# Patient Record
Sex: Female | Born: 1960 | Race: Black or African American | Hispanic: No | Smoking: Current every day smoker
Health system: Southern US, Community
[De-identification: ages and names within clinical notes are randomized; demographics above are authoritative.]

## PROBLEM LIST (undated history)

## (undated) DIAGNOSIS — I10 Essential (primary) hypertension: Secondary | ICD-10-CM

## (undated) DIAGNOSIS — I2699 Other pulmonary embolism without acute cor pulmonale: Secondary | ICD-10-CM

## (undated) DIAGNOSIS — F32A Depression, unspecified: Secondary | ICD-10-CM

## (undated) DIAGNOSIS — I509 Heart failure, unspecified: Secondary | ICD-10-CM

## (undated) DIAGNOSIS — F319 Bipolar disorder, unspecified: Secondary | ICD-10-CM

## (undated) DIAGNOSIS — G4730 Sleep apnea, unspecified: Secondary | ICD-10-CM

## (undated) DIAGNOSIS — F329 Major depressive disorder, single episode, unspecified: Secondary | ICD-10-CM

## (undated) DIAGNOSIS — J219 Acute bronchiolitis, unspecified: Secondary | ICD-10-CM

## (undated) HISTORY — PX: CHOLECYSTECTOMY: SHX55

## (undated) HISTORY — PX: CAROTID STENT: SHX1301

---

## 2005-02-02 ENCOUNTER — Inpatient Hospital Stay (HOSPITAL_COMMUNITY): Admission: AD | Admit: 2005-02-02 | Discharge: 2005-02-02 | Payer: Self-pay | Admitting: *Deleted

## 2005-02-19 ENCOUNTER — Ambulatory Visit: Payer: Self-pay | Admitting: *Deleted

## 2005-04-22 ENCOUNTER — Ambulatory Visit: Payer: Self-pay | Admitting: Cardiovascular Disease

## 2005-04-22 ENCOUNTER — Inpatient Hospital Stay (HOSPITAL_COMMUNITY): Admission: EM | Admit: 2005-04-22 | Discharge: 2005-04-25 | Payer: Self-pay | Admitting: Emergency Medicine

## 2005-04-29 ENCOUNTER — Ambulatory Visit: Payer: Self-pay | Admitting: Internal Medicine

## 2005-05-01 ENCOUNTER — Ambulatory Visit: Payer: Self-pay | Admitting: Internal Medicine

## 2005-05-05 ENCOUNTER — Ambulatory Visit: Payer: Self-pay | Admitting: Internal Medicine

## 2005-05-15 ENCOUNTER — Ambulatory Visit: Payer: Self-pay | Admitting: Cardiology

## 2005-05-22 ENCOUNTER — Ambulatory Visit: Payer: Self-pay | Admitting: Cardiology

## 2005-05-27 ENCOUNTER — Ambulatory Visit: Payer: Self-pay | Admitting: Cardiology

## 2006-01-27 ENCOUNTER — Ambulatory Visit: Payer: Self-pay | Admitting: Cardiology

## 2007-11-15 ENCOUNTER — Emergency Department (HOSPITAL_COMMUNITY): Admission: EM | Admit: 2007-11-15 | Discharge: 2007-11-15 | Payer: Self-pay | Admitting: Emergency Medicine

## 2007-11-16 ENCOUNTER — Inpatient Hospital Stay (HOSPITAL_COMMUNITY): Admission: EM | Admit: 2007-11-16 | Discharge: 2007-11-21 | Payer: Self-pay | Admitting: Emergency Medicine

## 2007-11-17 ENCOUNTER — Encounter (INDEPENDENT_AMBULATORY_CARE_PROVIDER_SITE_OTHER): Payer: Self-pay | Admitting: Internal Medicine

## 2009-02-01 ENCOUNTER — Encounter: Payer: Self-pay | Admitting: Cardiology

## 2009-02-25 ENCOUNTER — Emergency Department (HOSPITAL_COMMUNITY): Admission: EM | Admit: 2009-02-25 | Discharge: 2009-02-25 | Payer: Self-pay | Admitting: Emergency Medicine

## 2009-03-05 ENCOUNTER — Emergency Department (HOSPITAL_COMMUNITY): Admission: EM | Admit: 2009-03-05 | Discharge: 2009-03-05 | Payer: Self-pay | Admitting: Pediatrics

## 2009-04-10 ENCOUNTER — Ambulatory Visit: Payer: Self-pay | Admitting: Obstetrics and Gynecology

## 2009-04-10 ENCOUNTER — Other Ambulatory Visit: Admission: RE | Admit: 2009-04-10 | Discharge: 2009-04-10 | Payer: Self-pay | Admitting: Obstetrics and Gynecology

## 2009-05-15 ENCOUNTER — Ambulatory Visit: Payer: Self-pay | Admitting: Family Medicine

## 2009-05-29 ENCOUNTER — Ambulatory Visit: Payer: Self-pay | Admitting: Obstetrics and Gynecology

## 2009-06-24 ENCOUNTER — Ambulatory Visit (HOSPITAL_COMMUNITY): Admission: RE | Admit: 2009-06-24 | Discharge: 2009-06-24 | Payer: Self-pay | Admitting: Family Medicine

## 2009-06-24 ENCOUNTER — Ambulatory Visit: Payer: Self-pay | Admitting: Family Medicine

## 2009-07-12 ENCOUNTER — Inpatient Hospital Stay (HOSPITAL_COMMUNITY): Admission: AD | Admit: 2009-07-12 | Discharge: 2009-07-12 | Payer: Self-pay | Admitting: Obstetrics & Gynecology

## 2010-07-30 LAB — COMPREHENSIVE METABOLIC PANEL
ALT: 30 U/L (ref 0–35)
AST: 25 U/L (ref 0–37)
Albumin: 3.7 g/dL (ref 3.5–5.2)
Alkaline Phosphatase: 87 U/L (ref 39–117)
BUN: 12 mg/dL (ref 6–23)
CO2: 28 mEq/L (ref 19–32)
Calcium: 9.7 mg/dL (ref 8.4–10.5)
Chloride: 105 mEq/L (ref 96–112)
Creatinine, Ser: 0.92 mg/dL (ref 0.4–1.2)
GFR calc Af Amer: >60 mL/min (ref >60)
GFR calc non Af Amer: >60 mL/min (ref >60)
Glucose, Bld: 134 mg/dL — ABNORMAL HIGH (ref 70–99)
Potassium: 3.2 mEq/L — ABNORMAL LOW (ref 3.5–5.1)
Sodium: 138 mEq/L (ref 135–145)
Total Bilirubin: 0.6 mg/dL (ref 0.3–1.2)
Total Protein: 7.9 g/dL (ref 6.0–8.3)

## 2010-07-30 LAB — PREGNANCY, URINE: Preg Test, Ur: NEGATIVE

## 2010-07-30 LAB — CBC
HCT: 42.8 % (ref 36.0–46.0)
Hemoglobin: 14 g/dL (ref 12.0–15.0)
MCHC: 32.8 g/dL (ref 30.0–36.0)
MCV: 81.1 fL (ref 78.0–100.0)
Platelets: 285 10*3/uL (ref 150–400)
RBC: 5.28 MIL/uL — ABNORMAL HIGH (ref 3.87–5.11)
RDW: 15.7 % — ABNORMAL HIGH (ref 11.5–15.5)
WBC: 9.1 10*3/uL (ref 4.0–10.5)

## 2010-08-03 LAB — CBC
HCT: 40.3 % (ref 36.0–46.0)
Hemoglobin: 13.1 g/dL (ref 12.0–15.0)
MCHC: 32.5 g/dL (ref 30.0–36.0)
MCV: 82.3 fL (ref 78.0–100.0)
Platelets: 207 10*3/uL (ref 150–400)
RBC: 4.9 MIL/uL (ref 3.87–5.11)
RDW: 16.1 % — ABNORMAL HIGH (ref 11.5–15.5)
WBC: 9.1 10*3/uL (ref 4.0–10.5)

## 2010-08-14 LAB — BASIC METABOLIC PANEL
BUN: 10 mg/dL (ref 6–23)
CO2: 25 mEq/L (ref 19–32)
Calcium: 9.2 mg/dL (ref 8.4–10.5)
Chloride: 109 mEq/L (ref 96–112)
Creatinine, Ser: 0.85 mg/dL (ref 0.4–1.2)
GFR calc Af Amer: >60 mL/min (ref >60)
GFR calc non Af Amer: >60 mL/min (ref >60)
Glucose, Bld: 158 mg/dL — ABNORMAL HIGH (ref 70–99)
Potassium: 3.3 mEq/L — ABNORMAL LOW (ref 3.5–5.1)
Sodium: 138 mEq/L (ref 135–145)

## 2010-08-14 LAB — POCT I-STAT, CHEM 8
BUN: 5 mg/dL — ABNORMAL LOW (ref 6–23)
Calcium, Ion: 1.19 mmol/L (ref 1.12–1.32)
Chloride: 103 mEq/L (ref 96–112)
Creatinine, Ser: 0.9 mg/dL (ref 0.4–1.2)
Glucose, Bld: 89 mg/dL (ref 70–99)
HCT: 45 % (ref 36.0–46.0)
Hemoglobin: 15.3 g/dL — ABNORMAL HIGH (ref 12.0–15.0)
Potassium: 3.1 meq/L — ABNORMAL LOW (ref 3.5–5.1)
Sodium: 141 meq/L (ref 135–145)
TCO2: 25 mmol/L (ref 0–100)

## 2010-08-14 LAB — URINE MICROSCOPIC-ADD ON

## 2010-08-14 LAB — URINE CULTURE: Culture: NO GROWTH

## 2010-08-14 LAB — URINALYSIS, ROUTINE W REFLEX MICROSCOPIC
Bilirubin Urine: NEGATIVE
Bilirubin Urine: NEGATIVE
Glucose, UA: 250 mg/dL — AB
Glucose, UA: NEGATIVE mg/dL
Hgb urine dipstick: NEGATIVE
Hgb urine dipstick: NEGATIVE
Ketones, ur: NEGATIVE mg/dL
Ketones, ur: NEGATIVE mg/dL
Nitrite: NEGATIVE
Nitrite: NEGATIVE
Protein, ur: NEGATIVE mg/dL
Protein, ur: NEGATIVE mg/dL
Specific Gravity, Urine: 1.02 (ref 1.005–1.030)
Specific Gravity, Urine: 1.026 (ref 1.005–1.030)
Urobilinogen, UA: 0.2 mg/dL (ref 0.0–1.0)
Urobilinogen, UA: 0.2 mg/dL (ref 0.0–1.0)
pH: 6 (ref 5.0–8.0)
pH: 6 (ref 5.0–8.0)

## 2010-08-14 LAB — CBC
HCT: 39.2 % (ref 36.0–46.0)
HCT: 41.4 % (ref 36.0–46.0)
Hemoglobin: 13.4 g/dL (ref 12.0–15.0)
Hemoglobin: 13.9 g/dL (ref 12.0–15.0)
MCHC: 34.3 g/dL (ref 30.0–36.0)
MCV: 83.1 fL (ref 78.0–100.0)
Platelets: 227 10*3/uL (ref 150–400)
RBC: 4.71 MIL/uL (ref 3.87–5.11)
RBC: 4.93 MIL/uL (ref 3.87–5.11)
RDW: 15.5 % (ref 11.5–15.5)
RDW: 15.9 % — ABNORMAL HIGH (ref 11.5–15.5)
WBC: 8.9 10*3/uL (ref 4.0–10.5)
WBC: 9.5 10*3/uL (ref 4.0–10.5)

## 2010-08-14 LAB — DIFFERENTIAL
Band Neutrophils: 0 % (ref 0–10)
Basophils Absolute: 0.1 10*3/uL (ref 0.0–0.1)
Basophils Relative: 1 % (ref 0–1)
Eosinophils Absolute: 0.3 10*3/uL (ref 0.0–0.7)
Eosinophils Relative: 3 % (ref 0–5)
Lymphocytes Relative: 29 % (ref 12–46)
Lymphs Abs: 2.8 10*3/uL (ref 0.7–4.0)
Metamyelocytes Relative: 0 %
Monocytes Absolute: 0.3 10*3/uL (ref 0.1–1.0)
Monocytes Relative: 3 % (ref 3–12)

## 2010-08-14 LAB — WET PREP, GENITAL
Clue Cells Wet Prep HPF POC: NONE SEEN
Clue Cells Wet Prep HPF POC: NONE SEEN
Trich, Wet Prep: NONE SEEN
Yeast Wet Prep HPF POC: NONE SEEN
Yeast Wet Prep HPF POC: NONE SEEN

## 2010-08-14 LAB — PROTIME-INR
INR: 1.18 (ref 0.00–1.49)
Prothrombin Time: 14.4 seconds (ref 11.6–15.2)
Prothrombin Time: 14.9 seconds (ref 11.6–15.2)

## 2010-08-14 LAB — POCT PREGNANCY, URINE
Preg Test, Ur: NEGATIVE
Preg Test, Ur: NEGATIVE

## 2010-08-14 LAB — SAMPLE TO BLOOD BANK

## 2010-08-14 LAB — GC/CHLAMYDIA PROBE AMP, GENITAL
Chlamydia, DNA Probe: NEGATIVE
Chlamydia, DNA Probe: NEGATIVE
GC Probe Amp, Genital: NEGATIVE

## 2010-08-14 LAB — APTT: aPTT: 27 seconds (ref 24–37)

## 2010-08-14 LAB — GLUCOSE, CAPILLARY: Glucose-Capillary: 132 mg/dL — ABNORMAL HIGH (ref 70–99)

## 2010-08-14 LAB — RPR: RPR Ser Ql: NONREACTIVE

## 2010-09-23 NOTE — Discharge Summary (Signed)
NAME:  Amber Vaughn, Amber Vaughn NO.:  [id number]   MEDICAL RECORD NO.:  [id number]          PATIENT TYPE:  INP   LOCATION:  4731                         FACILITY:  [facility]   PHYSICIAN:  Hillery Aldo, M.D.   DATE OF BIRTH:  07/18/1960   DATE OF ADMISSION:  11/16/2007  DATE OF DISCHARGE:  11/21/2007                               DISCHARGE SUMMARY   PRIMARY CARE PHYSICIAN:  Dr. Michele Rockers in [place].   DISCHARGE DIAGNOSES:  1. Pulmonary embolism.  2. Obesity.  3. Obstructive sleep apnea/obesity hypoventilation syndrome.  4. Hypertensive urgency.  5. Diastolic dysfunction by echocardiogram.  6. Iron-deficiency anemia.  7. Vulvovaginal candidiasis.  8. Depression.  9. Gastroesophageal reflux disease.   DISCHARGE MEDICATIONS:  1. Lovenox 120 mg subcutaneously q.12 h.  2. Coumadin 15 mg daily.  3. Potassium chloride 20 mEq daily.  4. Zantac 150 mg b.i.d.  5. Desyrel 100 mg daily.  6. Celexa 20 mg daily.  7. Catapres 0.2 mg b.i.d.  8. Apresoline 25 mg t.i.d.  9. Iron sulfate 325 mg b.i.d.  10.Metoprolol XL 50 mg b.i.d.   CONSULTATIONS:  Dr. Lenoria Farrier A. Hoss, MD, of Interventional Radiology.   BRIEF ADMISSION HPI:  The patient is a 50 year old female with past  medical history of pulmonary embolism who was recently hospitalized in  [place], [place] for treatment of the same.  She has a history of  pulmonary embolism dating two years in the past as well.  She was  discharged on a combination of Coumadin and Lovenox from [place]  and represented to the hospital here with subtherapeutic INR and  problems with dyspnea.  She was admitted for further evaluation and  treatment.  For the full details, please see the dictated report done by  Dr. Christella Noa.   PROCEDURES AND DIAGNOSTIC STUDIES:  1. Chest x-ray on November 15, 2007, showed no active disease.  2. Chest x-ray on November 16, 2007, showed no acute cardiopulmonary      disease.  3. Ultrasound-guided placement of an IVC  filter done by Interventional      Radiology on November 18, 2007.   DISCHARGE LABORATORY VALUES:  PTT was 48, PT 13.2, and INR 1.0.   HOSPITAL COURSE:  1. Recurrent pulmonary embolism:  The patient has had recurrent      pulmonary embolism, and it is difficult to get therapeutic on      Coumadin.  Given this, the decision was made to pursue IVC filter      placement, which was accomplished on November 18, 2007.  The patient's      INR has been subtherapeutic throughout her hospital stay despite      aggressive therapy with Coumadin.  At this point, she remains on      Lovenox and will be discharged on Lovenox and 15 mg of Coumadin      daily with instructions to follow up with her primary care      physician for ongoing uptitration of her Coumadin to therapeutic      levels.  2. Obstructive sleep apnea:  The patient is maintained on CPAP  nightly.  3. Hypertensive urgency.  The patient was admitted with fairly      elevated blood pressure levels.  Her admission blood pressure was      199/90.  Her antihypertensive regimen was adjusted and at this      time, her blood pressure is reasonably controlled on the regimen as      outlined above.  4. Iron-deficiency anemia:  The patient had anemia panel done and was      found to have a microcytic anemia.  She has heavy menses.  She was      put on iron supplementation.  5. Vulvovaginal candidiasis.  The patient was given Diflucan and      instructed to follow up with her primary care physician for any      nonresolution of symptoms.   DISPOSITION:  The patient is medically stable and will be discharged  home today.      Hillery Aldo, M.D.  Electronically Signed     CR/MEDQ  D:  11/21/2007  T:  11/21/2007  Job:  [id number]

## 2010-09-23 NOTE — H&P (Signed)
NAME:  Amber Vaughn, Amber Vaughn NO.:  [id number]   MEDICAL RECORD NO.:  [id number]          PATIENT TYPE:  INP   LOCATION:  4731                         FACILITY[facility]  [facility]   PHYSICIAN:  Herbie Saxon, MDDATE OF BIRTH:  08/27/1960   DATE OF ADMISSION:  11/16/2007  DATE OF DISCHARGE:                              HISTORY & PHYSICAL   PRIMARY CARE PHYSICIAN:  Unassigned.   She has no health care power of attorney.  She is a full code.   PRESENTING COMPLAINT:  Shortness of breath, one week.   HISTORY OF PRESENTING COMPLAINT:  This is a 50 year old African American  female who was recently diagnosed with pulmonary embolism about 2 weeks  ago at [facility] in [place].  The patient was  started on Lovenox and Coumadin and there was a plan for possible IVC  placement.  However, for the last one week, she has been becoming  increasingly much short of breath associated with wheezing, intermittent  hemoptysis, and lightheadedness.  No fever.  She has an unproductive  cough mostly.  The patient represented back to the [facility]  in [place] about a week ago and she was admitted for a week and  discharged yesterday morning.  She presented to the [facility] Emergency  Room the last night with hemoptysis, with epistaxis, nose bleeding;  however with pressure this had stopped.  She was discharged home,  however, she came back to the emergency room again today complaining of  retrosternal chest pain, lightheadedness, low-grade fever.  At  presentation, her blood pressure was severely elevated at 199/90.  INR  was still subtherapeutic at 1.3.  Currently, she does not have any  epistaxis or hemoptysis, also blood glucose checked at presentation was  64.  The patient denies any abdominal distension, diarrhea, or nausea.  She has abdominal cramps intermittently.  No dysuria, hematuria, melena,  or hematemesis.  No new skin rash or joint swelling.   PAST MEDICAL HISTORY:  Hypertension and pulmonary embolism that was  diagnosed 2 years ago, but she was readmitted at [facility] 2 weeks ago.   PAST SURGICAL HISTORY:  Cholecystectomy and cesarean section x2.   SOCIAL HISTORY:  She smoked for more than 20 years, about 4-5 cigarettes  a day, which is ongoing until a week ago when she was admitted.  No  history of alcohol or illicit drug abuse.   FAMILY HISTORY:  Her father had chronic kidney disease, hypertension,  and myocardial infarction.  Mother has angina and hypertension.  Sister  has coronary artery disease.   REVIEW OF SYSTEMS:  The patient complains of headache and palpitation.  A 14 other systems are reviewed and negative.   ALLERGIES:  PENICILLIN, full dose ASPIRIN.  She claims she can tolerate  the baby aspirin.   MEDICATIONS:  1. Lovenox 120 mg twice daily.  2. Coumadin 11 mg daily.  3. Potassium chloride 20 mEq daily.  4. Zantac 150 mg twice daily.  5. Desyrel 100 mg daily.  6. Celexa 20 mg daily.  7. Catapres 0.2 mg twice daily.  8. Apresoline  25 mg b.i.d.   PHYSICAL EXAMINATION:  GENERAL:  On examination, she is a middle-aged  lady, obese, and not in acute respiratory distress.  VITAL SIGNS:  Temperature is 99, pulse 61, respiratory rate is 18, and  blood pressure 199/90.  HEENT:  Pupils are equal, reactive to light, and accommodation.  Not  pale, not jaundiced.  Oropharynx and nasopharynx are clear.  Mucous  membranes are moist.  Head is atraumatic and normocephalic.  NECK:  Supple.  There is no elevated JVD.  No thyromegaly.  No carotid  bruit.  HEART:  Apex beat at 5th intercostal space at the midclavicular line.  No heaves, no rubs, murmurs, or gallops.  Heart sounds 1 and 2, regular  rhythm and rate.  CHEST:  Clinically clear.  ABDOMEN:  Truncal obesity, soft, and nontender.  No organomegaly.  Inguinal orifices are patent.  NEUROLOGIC:  She is alert and oriented in time, place, and  person.  Cranial nerves II-XII are intact.  Power is 5 globally.  EXTREMITIES:  Peripheral pulses.  There is no pedal edema.   LABORATORY DATA:  Showed that the sodium 140, potassium 3.8, chloride  107, BUN 15, creatinine 1.4, and glucose 64.  BNP is 62.  PT 60.  INR is  1.3.  Chest x-ray shows no acute cardiopulmonary disease.   ASSESSMENT:  1. Subacute pulmonary embolism.  2. Suboptimal anticoagulation.  3. Hypertension.  4. Urgency.  5. Hypoglycemia.  6. Mild renal insufficiency.  7. Episodic epistaxis.  8. Morbid obesity.   PLAN:  The patient is to be admitted to a telemetry bed.  We will resume  Lovenox and Coumadin treatment to goal INR is 2-3 and then we will  discontinue the Lovenox.  We will obtain her old records from  [facility] and 2D echocardiogram, if she has not  had any in the last 6 months.  We will start on serial cardiac enzymes  and EKG q.8 h. x3.  Put her on morphine 2 mg IV q.6 h. p.r.n. for chest  pain, nitroglycerin 0.4 mg sublingual q. 5 minutes x 3, consider  nitroglycerin infusion.  We will start her on Toprol-XL 50 mg b.i.d.,  clonidine 0.2 mg b.i.d., Lopressor 2.5 mg IV q.6 h. p.r.n.  We will  continue her home medications.  Counsel on tobacco cessation.  Put on  nicotine patch 7 mg a day, Ambien 5 mg nightly p.r.n. for insomnia.  She  is to be on Phenergan 25 mg IV q.8 h. p.r.n. for nausea.  Put her on  protonix 40mg  IV daily, albuterol and Atrovent 1 unit dose q.6 h. p.r.n.  for shortness of breath.  Activity will be bedrest.  Diet will be heart-  healthy, low-cholesterol.  IV fluid D5-normal saline for 24 hours.  Watch her for fluid overload.  We will review admission labs, complete  blood count, fasting  lipid started from June 1st, homocysteine, urinalysis, Hemoccult x2.  We  will follow the serial cardiac enzymes.  She has to be on supplemental  oxygen 2-5 L by nasal cannula to keep her oxygen saturation at 90%.  She  has  had illness, medication and treatment plan explained to her and her  family did verbalize understanding.      Herbie Saxon, MD  Electronically Signed     MIO/MEDQ  D:  11/16/2007  T:  11/17/2007  Job:  [id number]

## 2010-09-26 NOTE — Cardiovascular Report (Signed)
NAME:  Amber Vaughn, Amber Vaughn NO.:  [id number]   MEDICAL RECORD NO.:  [id number]          PATIENT TYPE:  INP   LOCATION:  2008                         FACILITY:  [facility]   PHYSICIAN:  Charlton Haws, M.D.     DATE OF BIRTH:  [birth year]   DATE OF PROCEDURE:  04/22/2005  DATE OF DISCHARGE:                              CARDIAC CATHETERIZATION   CATHETERIZATION INDICATION:  Hypertensive urgency and chest pain.   Cine catheterization done from right femoral artery and vein.   We had a lot of difficulty finding the patient's femoral artery due to her  morbid obesity.   Using a Smart needle, we were able to locate the right femoral vein. A 5-  French sheath was placed there using fluoroscopic guidance and the venous  sheath as a landmark. We were finally able to find the arterial pulse and  place a 6-French sheath.   During the 20 minutes it took to place a sheath, the patient had a vagal  reaction. She was treated with fluids and atropine and was hemodynamically  stable and asymptomatic.   Left main coronary was normal.   Left anterior descending artery is normal.   Circumflex coronary artery was normal.   Right coronary artery was dominant and normal.   RAO VENTRICULOGRAPHY:  RAO ventriculography was normal. EF was 60%. There  was no gradient across the aortic valve. No MR. A single LAO aortic root  injection was performed. Due to the patient's chest pain, hypertensive  urgency, we felt the need to rule out aortic dissection.   Imaging of the aortic root showed no aneurysm, no evidence of dissection.   IMPRESSION:  The patient's chest pain appeared to be noncardiac in etiology.  Her aortic root also appears normal. She needs much better control of her  blood pressure. Due to difficulty cannulating her femoral artery and vein,  she will remain overnight. We will check hemoglobin and hematocrit in the  morning.   Overall, despite the somewhat long diagnostic  procedure, the patient  tolerated procedure well.           ______________________________  Charlton Haws, M.D.    PN/MEDQ  D:  04/22/2005  T:  04/23/2005  Job:  [id number]

## 2010-09-26 NOTE — Discharge Summary (Signed)
NAME:  Amber Vaughn, Amber Vaughn NO.:  [id number]   MEDICAL RECORD NO.:  [id number]          PATIENT TYPE:  INP   LOCATION:  2008                         FACILITY:  [facility]   PHYSICIAN:  Arvilla Meres, M.D. LHCDATE OF BIRTH:  July 03, 1960   DATE OF ADMISSION:  04/22/2005  DATE OF DISCHARGE:  04/25/2005                                 DISCHARGE SUMMARY   PRINCIPAL DIAGNOSIS:  Right lower lobe pulmonary embolism.   OTHER DIAGNOSES:  1.  Hypertensive urgency.  2.  Obstructive sleep apnea on CPAP.  3.  GERD.  4.  History of sickle cell trait.  5.  Obesity.  6.  History of cholecystectomy.   ALLERGIES:  1.  ASPIRIN causing throat swelling.  2.  PENICILLIN.  3.  AMOXICILLIN.   PROCEDURE:  Left heart cardiac catheterization.   HISTORY OF PRESENT ILLNESS:  A 50 year old African American female with no  prior history of CAD, presented to the [facility] ED, on April 22, 2005,  with a four-day history of anterior chest squeezing and sharp sensation  between 6 to 10 out of 10 with radiation to her left arm along with numbness  and tingling and mild nausea and shortness of breath.  The pain was  reproducible with position, eating, and deep breathing.  In the ED, EKG  showed sinus rhythm with left axis deviation and early repolarization  without any significant ST-T changes.  She was noted to be hypertensive with  a blood pressure of 193/103.  The decision was made to admit her for further  evaluation.   HOSPITAL COURSE:  The patient was re-initiated on antihypertensive  medication which she had not been taking at home and underwent a left heart  cardiac catheterization, on April 22, 2005, which revealed normal  coronary arteries with an EF of 60% and no evidence of dissection of her  aortic root.  A D-dimmer was subsequently ordered as there was no cardiac  cause for her chest pain and this was noted to be at the upper end of normal  at 0.43.  Regardless, a chest CT was  performed revealing a right lower lobe  pulmonary embolism.  She was subsequently initiated on Lovenox and Coumadin  therapy and will be discharged home today as she has been doing better  without recurrent chest pain or shortness of breath.  She will undergo  Lovenox teaching prior to discharge and will follow up at [facility] on April 28, 2005 for further evaluation of  anticoagulation.   DISCHARGE LABORATORY:  Hemoglobin 11.0, hematocrit 32.5, WBC 9.3, platelets  245.  PT 14.7, INR 1.1.  Sodium 138, potassium 3.2, chloride 104, CO2 27,  BUN 13, creatinine 0.9, glucose 63, calcium 9.1.  Magnesium 2.1.  Troponin  0.65, CK 152, MB 4.9.  Total cholesterol 135, triglycerides 54, HDL 62, LDL  62.  Antithrombin III 76.  Homocystine was elevated at 19.7.  TSH 2.950.   Additional labs that are pending include anticardiolipin antibodies, factor  V Leiden, lupus anticoagulant, protein C, and protein S.   DISPOSITION:  The patient is being discharged  home in good condition.   FOLLOWUP PLAN AND APPOINTMENTS:  1.  She will follow up in [facility] on April 28, 2005.  2.  She is asked to follow up with primary care physician in the future for      further management of PE.   DISCHARGE MEDICATIONS:  1.  Coumadin 5 mg q.h.s.  2.  Norvasc 10 mg every day.  3.  Protonix 40 mg every day.  4.  Lovenox 120 mg one injection q.12h. x5 days.  5.  Potassium 20 mEq every day.   OUTSTANDING LABORATORY STUDIES:  As listed in the discharge labs.   DURATION OF DISCHARGE ENCOUNTER:  Forty minutes.      Ok Anis, NP      Arvilla Meres, M.D. [facility]  Electronically Signed    CRB/MEDQ  D:  04/25/2005  T:  04/25/2005  Job:  [id number]

## 2010-09-26 NOTE — H&P (Signed)
NAME:  Amber Vaughn, Amber Vaughn NO.:  [id number]   MEDICAL RECORD NO.:  [id number]          PATIENT TYPE:  INP   LOCATION:  2008                         FACILITY:  [facility]   PHYSICIAN:  Arvilla Meres, M.D. LHCDATE OF BIRTH:  10-Aug-1960   DATE OF ADMISSION:  04/22/2005  DATE OF DISCHARGE:                                HISTORY & PHYSICAL   HISTORY OF PRESENT ILLNESS:  Ms. Giuliano is a 50 year old African/American  female who presents to the emergency room secondary to a four-day history of  chest discomfort.  She describes this as a constant waxing and waning  anterior chest squeezing/sharp sensation between 6-10 on a scale of 0 to 10.  This radiates into her left arm and left leg.  She has numbness and  tingling.  The last time it was a 0 was four days ago.  She has noticed some  occasional nausea and shortness of breath.  She is not sure if this is  actually associated with her chest discomfort.  She states that everything  makes it worse, including moving, eating and deep breathing.  She states  that the only thing that might ease it off slightly is becoming very relaxed  and lying in a certain position.  She has not obtained any relief with  Tylenol.  She denies any associated gas, water brash, diaphoresis or  injuries.  She had a similar occurrence about two years ago which she states  was not as bad as this one.  She saw her primary care physician in  [place], who did a stress test and told her about an abnormal heart  beat, and that he would watch her.   ALLERGIES:  ASPIRIN, which results in throat swelling.  PENICILLIN AND  AMOXICILLIN.   MEDICATIONS:  1.  Norvasc 25 mg daily.  She stated that she was on 10 mg daily, but      somebody at [facility] increased her to 25 mg daily three months      ago.  2.  Occasional Tylenol.   PAST MEDICAL/SURGICAL HISTORY:  1.  Hypertension for 16 years.  She does not routinely check this at home.      Once in  awhile she will check it, and it can range anywhere from the      20's to 200 over 100's to 200's.  2.  History of sickle cell trait.  3.  Obesity.  4.  Obstructive sleep apnea, for which she uses CPAP.  5.  Gastroesophageal reflux disease.  6.  She is G 4, P 3, status post two C-sections.  7.  Status post a cholecystectomy.  8.  Status post a T&A.   She denies any diabetes, myocardial infarction, CVA, bleeding dyscrasias or  thyroid disorder.  She does not know her cholesterol status.   SOCIAL HISTORY:  She recently moved to [facility] in July 2006, from  [place].  Her 66 year old daughter and her two sons 55 and 57 and a  grandchild age 73 live with her.  She is unemployed and on disability,  secondary to a learning problem.  She does  not have a car.  Prior to this  she worked for [employer] in [place].  She quit smoking 11 days ago.  Prior to  that she smoked at least 1/2 pack per day for 10 years.  She is separated.  She denies any alcohol, drugs or herbal medications.  She does maintain a  low-salt diet.  She states that she walks 1/2 mile a day without difficulty.   FAMILY HISTORY:  Her mother is alive and well in the 30's.  Her father is  deceased at age 75, with kidney failure and a history of heart problems.  She has four brothers, alive and well.  Two sisters, one age 38, has had a  stroke.   REVIEW OF SYSTEMS:  Notable for currently experiencing a headache and  chronic sinus problems.  She is supposed to wear glasses.  Occasional  palpitations every other day, which lasts for less than 20 minutes and  leaves her feeling woozy and nauseated.  Edema, which sometimes resolves  overnight.  A productive yellow cough with rare very slight hemoptysis.  Last menstrual period was on April 17, 2005.  Positive nocturia.  Some  depression and anxiety with increased stress at home.  Nausea and vomiting  in the last four days.  Rare bright red blood per rectum.  Abdominal   discomfort.  Rare constipation, especially over the last three to four days.  Her last bowel movement was yesterday.   PHYSICAL EXAMINATION:  VITAL SIGNS:  Temperature 98 degrees, pulse 68,  respirations 20, blood pressure initially 193/103 and now is 174/96.  GENERAL:  A well-developed and well-nourished African/American female, who  appears uncomfortable, but is in no acute distress.  HEENT:  Unremarkable.  NECK:  Supple without thyromegaly, adenopathy, jugular venous distention or  carotid bruits.  CHEST:  Symmetrical excursions.  LUNGS:  Clear to auscultation.  HEART:  PMI is not displaced.  A regular rate and rhythm without murmur, rub  or gallops.  She does have an S4.  All pulses are symmetrical and equal.  ABDOMEN:  No abdominal or femoral bruits.  Obese, bowel sounds present.  No  organomegaly, masses or tenderness.  SKIN:  Intact without rashes.  EXTREMITIES:  No clubbing, cyanosis or edema.  Peripheral pulses are  symmetrical and intact.  MUSCULOSKELETAL:  Unremarkable.  She does not have any reproducible chest  tenderness on palpation.  NEUROLOGIC:  Grossly intact.   Chest x-ray does not show any active disease.   Electrocardiogram shows a normal sinus rhythm, left axis deviation, left  ventricular dysfunction with repolarization changes.  No old  electrocardiograms are available.   LABORATORY DATA:  Hemoglobin 13.9, hematocrit 41.  Sodium 139, potassium  3.2, BUN 12, creatinine 7.9.  Coags, creatinine and other labs have not been  performed.  Emergency room markers were essentially unremarkable x3, except  for a troponin of 0.06.   IMPRESSION:  1.  Atypical chest discomfort.  2.  Hypertension, now controlled, with left ventricular dysfunction and      repolarization changes on electrocardiogram.  3.  Obesity.  4.  Remote tobacco use.   DISPOSITION:  Dr. Arvilla Meres reviewed the patient's history and spoke with and examined the patient.  With the multiple  cardiac risk factors, Dr.  Gala Romney discussed the options of a cardiac evaluation.  The patient stated  that she wanted to know for sure if she has any coronary artery disease.  Thus a cardiac catheterization is recommended.  The procedure, risks  and  benefits have been explained to the patient by Dr. Gala Romney, and will  proceed with a cardiac catheterization today.  E will resume Norvasc at 10  mg p.o. daily and add hydrochlorothiazide 25 mg daily after her cardiac  catheterization.  She needs a primary care physician for routine followup.  We also asked the case manager to assist with any home needs.      Joellyn Rued, P.A. [facility]      Arvilla Meres, M.D. [facility]  Electronically Signed    EW/MEDQ  D:  04/22/2005  T:  04/22/2005  Job:  [id number]

## 2010-09-26 NOTE — Group Therapy Note (Signed)
NAME:  Amber Vaughn, Amber Vaughn NO.:  [id number]   MEDICAL RECORD NO.:  [id number]          PATIENT TYPE:  WOC   LOCATION:  [facility]                   FACILITY:  [facility]   PHYSICIAN:  Ellis Parents, MD    DATE OF BIRTH:  Apr 27, 1961   DATE OF SERVICE:                                    CLINIC NOTE   HISTORY OF PRESENT ILLNESS:  This 50 year old gravida 2, para 2, with a last  menstrual period of February 18, 2005, comes in for a routine annual Pap  smear.  The patient's periods are regular.  She has no gynecologic  complaints.  The patient just moved down to [place] from [place] where  she was treated for hypertension.  She is currently on hydrochlorothiazide  25 mg daily.   PHYSICAL EXAMINATION:  GENITOURINARY:  External genitalia is normal.  The  vagina is clean.  The cervix is well epithelialized, and appears healthy.  The uterus is anterior, and normal size.  Both adnexa are soft.  Pap smear  was taken.  VITAL SIGNS:  Blood pressure 186/114.  Weight 251.6 pounds.  Height 5 feet 5  inches.   MEDICAL DECISION MAKING:  The patient is being referred to a medical clinic  for control of hypertension.           ______________________________  Ellis Parents, MD     SA/MEDQ  D:  02/19/2005  T:  02/19/2005  Job:  [id number]

## 2010-09-26 NOTE — Assessment & Plan Note (Signed)
[facility]                            [facility] OFFICE NOTE   Amber Vaughn, Amber Vaughn                    MRN:          [id number]  DATE:01/27/2006                            DOB:          1960-06-22    HISTORY OF PRESENT ILLNESS:  The patient is a 50 year old female from  [place], [place].  She was sent to [facility] recently and underwent  catheterization.  There was no significant coronary artery disease.  A CT  scan revealed a questionable small pulmonary embolism.  It appeared to be a  technically difficult study.  The patient was then placed on Coumadin but  developed complications including hemoptysis and hematemesis.  We then  referred the patient for an esophagram due to her complaints of dysphagia  and odynophagia.  This was within normal limits.  The patient now presents  again with atypical chest pain to [facility].  She was told that  she had a small pulmonary embolism.  She was placed on Coumadin but,  unfortunately, there were no instructions given and the patient does not  know the dose of Coumadin that she is taking.  She is here in the office and  has no idea that her INR needs to be monitored.   She denies any substernal chest pain.  She denies any shortness of breath  and she is essentially asymptomatic.   MEDICATIONS:  Include  1.  Coumadin of unknown dose.  2.  Norvasc 10 mg a day.  3.  Protonix 40 mg a day.  4.  Potassium chloride.   PHYSICAL EXAMINATION:  VITAL SIGNS:  Blood pressure is 152/80.  Heart is 60  beats per minute.  She weighs 275 pound.  NECK:  Normal carotid upstroke and no carotid bruits.  LUNGS:  Clear.  HEART:  Regular rate and rhythm.  ABDOMEN:  Soft.  EXTREMITIES:  No edema.   PROBLEMS:  1.  Atypical chest pain.      1.  Negative cardiac catheterization, December 2006.      2.  Questionable history of pulmonary embolism.  (1)  Positive CT scan at [facility] in 2006.  (2)  Two negative  followup CT scans for pulmonary embolism with negative  lower venous extremity Dopplers.  c.  Recent supposedly positive CT scan for pulmonary emboli at [facility]  but with no discharge instruction on how to dose Coumadin.  1.  Hematemesis on Lovenox and Coumadin.  2.  Rule out for esophageal stricture.   PLAN:  1.  The patient has no idea what dose of Coumadin she is taking.  She has no      idea that she needs to followup her INR.  We checked in the office today      and it was 1.1.  Very poor discharge instructions were given to this      patient.  2.  I will request the CT study from [facility] to be reviewed here.  I am      very much questioning whether the patient truly had a pulmonary      embolism.  In any  event, I will not stop her Coumadin for now as she is      not having any significant side effects; we will monitor her INR closely      in the interim until final review of the studies.                                   Learta Codding, MD,FACC   GED/MedQ  DD:  01/27/2006  DT:  01/28/2006  Job #:  [id number]

## 2011-02-05 LAB — POCT I-STAT, CHEM 8
BUN: 15
Calcium, Ion: 1.19
Hemoglobin: 11.6 — ABNORMAL LOW
TCO2: 24

## 2011-02-05 LAB — BASIC METABOLIC PANEL
BUN: 11
Calcium: 8.8
Calcium: 8.9
Creatinine, Ser: 0.99
GFR calc Af Amer: >60
GFR calc Af Amer: >60
GFR calc non Af Amer: 56 — ABNORMAL LOW
GFR calc non Af Amer: >60
Glucose, Bld: 80
Potassium: 3.9
Potassium: 4
Sodium: 136

## 2011-02-05 LAB — PROTIME-INR
INR: 1.3
INR: 1.4
INR: 1.5
INR: 1.7 — ABNORMAL HIGH
INR: 1.9 — ABNORMAL HIGH
Prothrombin Time: 13.2
Prothrombin Time: 16.2 — ABNORMAL HIGH
Prothrombin Time: 20.5 — ABNORMAL HIGH
Prothrombin Time: 22.6 — ABNORMAL HIGH

## 2011-02-05 LAB — CBC
HCT: 29.8 — ABNORMAL LOW
HCT: 29.9 — ABNORMAL LOW
HCT: 31.2 — ABNORMAL LOW
Hemoglobin: 10.3 — ABNORMAL LOW
Hemoglobin: 9.6 — ABNORMAL LOW
Hemoglobin: 9.7 — ABNORMAL LOW
MCHC: 33
MCV: 71.5 — ABNORMAL LOW
MCV: 71.7 — ABNORMAL LOW
MCV: 72.6 — ABNORMAL LOW
Platelets: 273
Platelets: 296
Platelets: 296
Platelets: 309
RBC: 4.15
RDW: 19.8 — ABNORMAL HIGH
RDW: 20 — ABNORMAL HIGH
WBC: 7.7
WBC: 8.6

## 2011-02-05 LAB — APTT
aPTT: 34
aPTT: 38 — ABNORMAL HIGH
aPTT: 42 — ABNORMAL HIGH
aPTT: 44 — ABNORMAL HIGH
aPTT: 44 — ABNORMAL HIGH
aPTT: 48 — ABNORMAL HIGH

## 2011-02-05 LAB — COMPREHENSIVE METABOLIC PANEL
Albumin: 3.4 — ABNORMAL LOW
BUN: 14
Calcium: 9.4
Glucose, Bld: 90
Sodium: 135
Total Protein: 7.5

## 2011-02-05 LAB — IRON AND TIBC
Iron: 17 — ABNORMAL LOW
TIBC: 370

## 2011-02-05 LAB — DIFFERENTIAL
Basophils Relative: 1
Eosinophils Absolute: 0.1
Lymphs Abs: 2.3
Monocytes Absolute: 0.4
Monocytes Absolute: 0.5
Monocytes Relative: 6
Monocytes Relative: 7
Neutro Abs: 4.7
Neutrophils Relative %: 62
Neutrophils Relative %: 65

## 2011-02-05 LAB — URINALYSIS, MICROSCOPIC ONLY
Bilirubin Urine: NEGATIVE
Glucose, UA: NEGATIVE
Ketones, ur: NEGATIVE
Nitrite: NEGATIVE
Specific Gravity, Urine: 1.018
pH: 6

## 2011-02-05 LAB — CARDIAC PANEL(CRET KIN+CKTOT+MB+TROPI)
CK, MB: 1.1
CK, MB: 1.4
Relative Index: INVALID
Total CK: 108
Troponin I: 0.01

## 2011-02-05 LAB — POCT I-STAT 3, ART BLOOD GAS (G3+)
O2 Saturation: 97
Operator id: 138421
pCO2 arterial: 35.8
pH, Arterial: 7.434 — ABNORMAL HIGH

## 2011-02-05 LAB — CK TOTAL AND CKMB (NOT AT ARMC): Relative Index: INVALID

## 2011-02-05 LAB — VITAMIN B12: Vitamin B-12: 649 (ref 211–911)

## 2011-02-05 LAB — B-NATRIURETIC PEPTIDE (CONVERTED LAB): Pro B Natriuretic peptide (BNP): 62

## 2011-02-05 LAB — TSH: TSH: 3.173 (ref 0.350–4.500)

## 2011-02-05 LAB — LIPID PANEL: Cholesterol: 105

## 2011-02-05 LAB — RETICULOCYTES: Retic Count, Absolute: 44.2

## 2011-02-15 ENCOUNTER — Emergency Department (HOSPITAL_COMMUNITY)
Admission: EM | Admit: 2011-02-15 | Discharge: 2011-02-16 | Disposition: A | Discharge status: Home / Self Care | Payer: Medicaid - Out of State | Attending: Emergency Medicine | Admitting: Emergency Medicine

## 2011-02-15 ENCOUNTER — Emergency Department (HOSPITAL_COMMUNITY): Payer: Medicaid - Out of State

## 2011-02-15 DIAGNOSIS — K219 Gastro-esophageal reflux disease without esophagitis: Secondary | ICD-10-CM | POA: Insufficient documentation

## 2011-02-15 DIAGNOSIS — N390 Urinary tract infection, site not specified: Secondary | ICD-10-CM | POA: Insufficient documentation

## 2011-02-15 DIAGNOSIS — I509 Heart failure, unspecified: Secondary | ICD-10-CM | POA: Insufficient documentation

## 2011-02-15 DIAGNOSIS — Z86718 Personal history of other venous thrombosis and embolism: Secondary | ICD-10-CM | POA: Insufficient documentation

## 2011-02-15 DIAGNOSIS — I10 Essential (primary) hypertension: Secondary | ICD-10-CM | POA: Insufficient documentation

## 2011-02-15 DIAGNOSIS — Z79899 Other long term (current) drug therapy: Secondary | ICD-10-CM | POA: Insufficient documentation

## 2011-02-15 DIAGNOSIS — F29 Unspecified psychosis not due to a substance or known physiological condition: Secondary | ICD-10-CM | POA: Insufficient documentation

## 2011-02-15 LAB — COMPREHENSIVE METABOLIC PANEL
AST: 96 U/L — ABNORMAL HIGH (ref 0–37)
Albumin: 3.5 g/dL (ref 3.5–5.2)
Alkaline Phosphatase: 85 U/L (ref 39–117)
Chloride: 105 mEq/L (ref 96–112)
Potassium: 3.4 mEq/L — ABNORMAL LOW (ref 3.5–5.1)
Total Bilirubin: 0.5 mg/dL (ref 0.3–1.2)

## 2011-02-15 LAB — URINALYSIS, ROUTINE W REFLEX MICROSCOPIC
Glucose, UA: NEGATIVE mg/dL
Ketones, ur: 15 mg/dL — AB
pH: 6 (ref 5.0–8.0)

## 2011-02-15 LAB — DIFFERENTIAL
Band Neutrophils: 0 % (ref 0–10)
Blasts: 0 %
Eosinophils Relative: 0 % (ref 0–5)
Lymphocytes Relative: 63 % — ABNORMAL HIGH (ref 12–46)
Lymphs Abs: 6.2 10*3/uL — ABNORMAL HIGH (ref 0.7–4.0)
Monocytes Absolute: 0.2 10*3/uL (ref 0.1–1.0)
Monocytes Relative: 2 % — ABNORMAL LOW (ref 3–12)
nRBC: 0 /100 WBC

## 2011-02-15 LAB — RAPID URINE DRUG SCREEN, HOSP PERFORMED
Amphetamines: NOT DETECTED
Opiates: NOT DETECTED
Tetrahydrocannabinol: NOT DETECTED

## 2011-02-15 LAB — CBC
HCT: 42.5 % (ref 36.0–46.0)
MCHC: 35.3 g/dL (ref 30.0–36.0)
MCV: 78.3 fL (ref 78.0–100.0)
RDW: 15 % (ref 11.5–15.5)

## 2011-02-15 LAB — URINE MICROSCOPIC-ADD ON

## 2011-02-15 LAB — POCT I-STAT TROPONIN I: Troponin i, poc: 0.06 ng/mL (ref 0.00–0.08)

## 2011-02-16 LAB — URINE CULTURE

## 2011-04-02 ENCOUNTER — Emergency Department (HOSPITAL_COMMUNITY)
Admission: EM | Admit: 2011-04-02 | Discharge: 2011-04-02 | Disposition: A | Discharge status: Home / Self Care | Payer: Medicaid - Out of State | Attending: Emergency Medicine | Admitting: Emergency Medicine

## 2011-04-02 ENCOUNTER — Encounter: Payer: Self-pay | Admitting: Emergency Medicine

## 2011-04-02 DIAGNOSIS — Z79899 Other long term (current) drug therapy: Secondary | ICD-10-CM | POA: Insufficient documentation

## 2011-04-02 DIAGNOSIS — M7989 Other specified soft tissue disorders: Secondary | ICD-10-CM | POA: Insufficient documentation

## 2011-04-02 DIAGNOSIS — Z86718 Personal history of other venous thrombosis and embolism: Secondary | ICD-10-CM | POA: Insufficient documentation

## 2011-04-02 DIAGNOSIS — M79609 Pain in unspecified limb: Secondary | ICD-10-CM | POA: Insufficient documentation

## 2011-04-02 DIAGNOSIS — R609 Edema, unspecified: Secondary | ICD-10-CM | POA: Insufficient documentation

## 2011-04-02 DIAGNOSIS — I509 Heart failure, unspecified: Secondary | ICD-10-CM | POA: Insufficient documentation

## 2011-04-02 DIAGNOSIS — I10 Essential (primary) hypertension: Secondary | ICD-10-CM | POA: Insufficient documentation

## 2011-04-02 DIAGNOSIS — F319 Bipolar disorder, unspecified: Secondary | ICD-10-CM | POA: Insufficient documentation

## 2011-04-02 HISTORY — DX: Essential (primary) hypertension: I10

## 2011-04-02 HISTORY — DX: Bipolar disorder, unspecified: F31.9

## 2011-04-02 HISTORY — DX: Heart failure, unspecified: I50.9

## 2011-04-02 MED ORDER — ENOXAPARIN SODIUM 100 MG/ML ~~LOC~~ SOLN
100.0000 mg | SUBCUTANEOUS | Status: AC
  Administered 2011-04-02: 100 mg via SUBCUTANEOUS
  Filled 2011-04-02: qty 1

## 2011-04-02 NOTE — ED Notes (Signed)
Pt shows no signs of neuro deficits 

## 2011-04-02 NOTE — ED Notes (Signed)
Pt complaint of LLE pain from below knee to ankle. Slight swelling. Skin cool

## 2011-04-02 NOTE — ED Notes (Signed)
PT. REPORTS LEFT CALF PAIN X 1 WEEK , DENIES INJURY OR FALL ,  STATES HISTORY OF DVT .

## 2011-04-02 NOTE — ED Provider Notes (Signed)
History     CSN: [id number] Arrival date & time: 04/02/2011  8:00 PM   First MD Initiated Contact with Patient 04/02/11 2015      Chief Complaint  Patient presents with  . Leg Pain    (Consider location/radiation/quality/duration/timing/severity/associated sxs/prior treatment) Patient is a 50 y.o. female presenting with leg pain. The history is provided by the patient.  Leg Pain  The incident occurred more than 1 week ago. The incident occurred at home. There was no injury mechanism. The pain is present in the left leg. The quality of the pain is described as aching. The pain is moderate. The pain has been constant since onset. Pertinent negatives include no numbness, no inability to bear weight, no loss of motion, no muscle weakness, no loss of sensation and no tingling. She reports no foreign bodies present. The symptoms are aggravated by bearing weight. She has tried nothing for the symptoms.    Past Medical History  Diagnosis Date  . DVT (deep vein thrombosis) in pregnancy   . Hypertension   . CHF (congestive heart failure)   . Bipolar 1 disorder     Past Surgical History  Procedure Date  . Cesarean section   . Cholecystectomy   . Carotid stent     No family history on file.  History  Substance Use Topics  . Smoking status: Current Everyday Smoker  . Smokeless tobacco: Not on file  . Alcohol Use: No    OB History    Grav Para Term Preterm Abortions TAB SAB Ect Mult Living                  Review of Systems  Constitutional: Negative for fever and chills.  Respiratory: Negative for cough and shortness of breath.   Cardiovascular: Negative for chest pain and palpitations.  Gastrointestinal: Negative for nausea and vomiting.  Musculoskeletal: Negative for myalgias and arthralgias.  Skin: Negative for color change and rash.  Neurological: Negative for tingling, weakness and numbness.  All other systems reviewed and are negative.    Allergies  Amoxicillin;  Aspirin; and Penicillins  Home Medications   Current Outpatient Rx  Name Route Sig Dispense Refill  . AMLODIPINE BESYLATE 10 MG PO TABS Oral Take 10 mg by mouth daily.      . ATENOLOL 50 MG PO TABS Oral Take 50 mg by mouth daily.      Marland Kitchen CALCIUM CARBONATE ANTACID 500 MG PO CHEW Oral Chew 3 tablets by mouth daily as needed. For heartburn     . CHLORTHALIDONE 25 MG PO TABS Oral Take 25 mg by mouth daily.      Marland Kitchen HYDROCODONE-ACETAMINOPHEN 10-650 MG PO TABS Oral Take 1 tablet by mouth every 6 (six) hours as needed. For pain     . LISINOPRIL 40 MG PO TABS Oral Take 40 mg by mouth 2 (two) times daily.      Marland Kitchen POTASSIUM CHLORIDE 20 MEQ PO PACK Oral Take 20 mEq by mouth.      . SERTRALINE HCL 100 MG PO TABS Oral Take 100 mg by mouth daily.        BP 180/108  Pulse 62  Temp(Src) 98.4 F (36.9 C) (Oral)  Resp 19  SpO2 96%  LMP 04/02/2011  Physical Exam  Nursing note and vitals reviewed. Constitutional: She is oriented to person, place, and time. She appears well-developed and well-nourished.  HENT:  Head: Normocephalic and atraumatic.  Eyes: Pupils are equal, round, and reactive to light.  Cardiovascular:  Normal rate, regular rhythm and normal heart sounds.   Pulmonary/Chest: Effort normal and breath sounds normal. No respiratory distress.  Abdominal: Soft. There is no tenderness.  Musculoskeletal: Normal range of motion. She exhibits tenderness (L calf; mild swelling of calf compared to R leg; no warmth, erythema, pitting edema).  Neurological: She is alert and oriented to person, place, and time.  Skin: Skin is warm and dry.  Psychiatric: She has a normal mood and affect.    ED Course  Procedures (including critical care time)  Labs Reviewed - No data to display No results found.   1. Swelling of lower limb       MDM  51 yo F presents with 1 week of L lower extremity pain and swelling. Is worse with use and movement. Denies injury to the leg. States she has a prior PE, and  was on coumadin up until ~1 year ago; had an IVC filter placed. Denies current SOB or chest pain. Exam remarkable for mild swelling of L lower leg compared to R, with ttp along posterior leg, full ROM of knee and ankle, though pain with movement; no erythema, warmth, signs of injury to indicate infection/cellulitis, no focal tenderness, swelling, ecchymoses to indicate underlying fracture. Due to patient history of prior PE, will arrange for outpatient Duplex to be done tomorrow; will treat with lovenox prior to discharge. Patient without current complaints to suggest recurrent PE, and has filter in place, so likelihood of developing clinically significant PE is low, so patient is stable for d/c home with return tomorrow. Discussed worsening symptoms, and indications for return, and patient expresses understanding.        Theotis Burrow, MD 04/03/11 [phone]

## 2011-04-03 ENCOUNTER — Ambulatory Visit (HOSPITAL_COMMUNITY): Payer: Medicaid - Out of State

## 2011-04-03 NOTE — ED Provider Notes (Signed)
I saw and evaluated the patient, reviewed the resident's note and I agree with the findings and plan.   Juliet Rude. Rubin Payor, MD 04/03/11 [phone]

## 2011-04-04 ENCOUNTER — Emergency Department (HOSPITAL_COMMUNITY)
Admission: EM | Admit: 2011-04-04 | Discharge: 2011-04-04 | Disposition: A | Discharge status: Home / Self Care | Payer: Medicaid - Out of State | Attending: Emergency Medicine | Admitting: Emergency Medicine

## 2011-04-04 ENCOUNTER — Ambulatory Visit (HOSPITAL_COMMUNITY)
Admission: RE | Admit: 2011-04-04 | Discharge: 2011-04-04 | Disposition: A | Discharge status: Home / Self Care | Payer: Medicaid - Out of State | Source: Ambulatory Visit | Attending: Emergency Medicine | Admitting: Emergency Medicine

## 2011-04-04 DIAGNOSIS — I509 Heart failure, unspecified: Secondary | ICD-10-CM | POA: Insufficient documentation

## 2011-04-04 DIAGNOSIS — I10 Essential (primary) hypertension: Secondary | ICD-10-CM | POA: Insufficient documentation

## 2011-04-04 DIAGNOSIS — Z79899 Other long term (current) drug therapy: Secondary | ICD-10-CM | POA: Insufficient documentation

## 2011-04-04 DIAGNOSIS — R609 Edema, unspecified: Secondary | ICD-10-CM | POA: Insufficient documentation

## 2011-04-04 DIAGNOSIS — Z86718 Personal history of other venous thrombosis and embolism: Secondary | ICD-10-CM | POA: Insufficient documentation

## 2011-04-04 DIAGNOSIS — M7989 Other specified soft tissue disorders: Secondary | ICD-10-CM

## 2011-04-04 DIAGNOSIS — IMO0001 Reserved for inherently not codable concepts without codable children: Secondary | ICD-10-CM | POA: Insufficient documentation

## 2011-04-04 DIAGNOSIS — M79609 Pain in unspecified limb: Secondary | ICD-10-CM

## 2011-04-04 DIAGNOSIS — I82409 Acute embolism and thrombosis of unspecified deep veins of unspecified lower extremity: Secondary | ICD-10-CM

## 2011-04-04 DIAGNOSIS — F319 Bipolar disorder, unspecified: Secondary | ICD-10-CM | POA: Insufficient documentation

## 2011-04-04 DIAGNOSIS — I824Z9 Acute embolism and thrombosis of unspecified deep veins of unspecified distal lower extremity: Secondary | ICD-10-CM | POA: Insufficient documentation

## 2011-04-04 LAB — CBC
HCT: 42.4 % (ref 36.0–46.0)
Hemoglobin: 14.2 g/dL (ref 12.0–15.0)
MCV: 80.6 fL (ref 78.0–100.0)
RBC: 5.26 MIL/uL — ABNORMAL HIGH (ref 3.87–5.11)
WBC: 6 10*3/uL (ref 4.0–10.5)

## 2011-04-04 LAB — BASIC METABOLIC PANEL
BUN: 15 mg/dL (ref 6–23)
CO2: 26 mEq/L (ref 19–32)
Chloride: 104 mEq/L (ref 96–112)
Creatinine, Ser: 0.91 mg/dL (ref 0.50–1.10)
Glucose, Bld: 73 mg/dL (ref 70–99)

## 2011-04-04 MED ORDER — WARFARIN SODIUM 5 MG PO TABS
5.0000 mg | ORAL_TABLET | ORAL | Status: AC
  Administered 2011-04-04: 5 mg via ORAL
  Filled 2011-04-04: qty 1

## 2011-04-04 MED ORDER — ENOXAPARIN SODIUM 150 MG/ML ~~LOC~~ SOLN
125.0000 mg | SUBCUTANEOUS | Status: AC
  Administered 2011-04-04: 150 mg via SUBCUTANEOUS
  Filled 2011-04-04: qty 1

## 2011-04-04 MED ORDER — ENOXAPARIN SODIUM 30 MG/0.3ML ~~LOC~~ SOLN: 1.0000 mg/kg | Freq: Two times a day (BID) | SUBCUTANEOUS | Status: DC

## 2011-04-04 MED ORDER — WARFARIN SODIUM 5 MG PO TABS: 5.0000 mg | ORAL_TABLET | Freq: Every day | ORAL | Status: DC

## 2011-04-04 NOTE — ED Provider Notes (Signed)
I have personally performed and participated in all the services and procedures documented herein. I have reviewed the findings with the patient.   Dione Booze, MD 04/04/11 [phone]

## 2011-04-04 NOTE — ED Provider Notes (Signed)
History     CSN: [id number] Arrival date & time: 04/04/2011 12:49 PM   First MD Initiated Contact with Patient 04/04/11 1318      Chief Complaint  Patient presents with  . DVT    (Consider location/radiation/quality/duration/timing/severity/associated sxs/prior treatment) HPI Comments: Patient with history of DVT presents after having an outpatient ultrasound performed showing an occlusive acute DVT in the left peroneal vein.  She initially had onset of left calf pain and swelling one week ago. She was seen in emergency department 2 days ago and an outpatient ultrasound was ordered and the patient was given a dose of Lovenox. The patient has IVC filter in place. She denies any shortness of breath, pleuritic chest pain.  Patient is a 50 y.o. female presenting with leg pain. The history is provided by the patient.  Leg Pain  The incident occurred more than 2 days ago. There was no injury mechanism. The pain is present in the left leg. Pertinent negatives include no loss of motion and no loss of sensation.    Past Medical History  Diagnosis Date  . DVT (deep vein thrombosis) in pregnancy   . Hypertension   . CHF (congestive heart failure)   . Bipolar 1 disorder     Past Surgical History  Procedure Date  . Cesarean section   . Cholecystectomy   . Carotid stent     No family history on file.  History  Substance Use Topics  . Smoking status: Current Everyday Smoker  . Smokeless tobacco: Not on file  . Alcohol Use: No    OB History    Grav Para Term Preterm Abortions TAB SAB Ect Mult Living                  Review of Systems  Constitutional: Negative for fever and chills.  HENT: Negative for sore throat and rhinorrhea.   Eyes: Negative for discharge.  Respiratory: Negative for shortness of breath.   Cardiovascular: Positive for leg swelling. Negative for chest pain.  Gastrointestinal: Negative for nausea, vomiting, abdominal pain, diarrhea and constipation.    Genitourinary: Negative for dysuria.  Musculoskeletal: Positive for myalgias.  Skin: Negative for rash.  Neurological: Negative for headaches.  Psychiatric/Behavioral: Negative for confusion.    Allergies  Amoxicillin; Aspirin; and Penicillins  Home Medications   Current Outpatient Rx  Name Route Sig Dispense Refill  . ALBUTEROL SULFATE HFA 108 (90 BASE) MCG/ACT IN AERS Inhalation Inhale 1 puff into the lungs every 4 (four) hours as needed. For wheezing or shortness of breath     . AMLODIPINE BESYLATE 10 MG PO TABS Oral Take 10 mg by mouth daily.     . ATENOLOL 50 MG PO TABS Oral Take 50 mg by mouth daily.      Marland Kitchen CALCIUM CARBONATE ANTACID 500 MG PO CHEW Oral Chew 3 tablets by mouth daily as needed. For heartburn     . CHLORTHALIDONE 25 MG PO TABS Oral Take 25 mg by mouth daily.      Marland Kitchen HYDROCODONE-ACETAMINOPHEN 10-650 MG PO TABS Oral Take 1 tablet by mouth every 6 (six) hours as needed. For pain     . LISINOPRIL 40 MG PO TABS Oral Take 40 mg by mouth 2 (two) times daily.      Marland Kitchen POTASSIUM CHLORIDE 20 MEQ PO PACK Oral Take 20 mEq by mouth.      . SERTRALINE HCL 100 MG PO TABS Oral Take 100 mg by mouth daily.  BP 156/81  Pulse 52  Temp(Src) 98.1 F (36.7 C) (Oral)  SpO2 99%  LMP 04/02/2011  Physical Exam  Nursing note and vitals reviewed. Constitutional: She is oriented to person, place, and time. She appears well-developed and well-nourished.  HENT:  Head: Normocephalic and atraumatic.  Eyes: Right eye exhibits no discharge. Left eye exhibits no discharge.  Neck: Normal range of motion. Neck supple.  Cardiovascular: Normal rate and regular rhythm.  Exam reveals no gallop and no friction rub.   No murmur heard. Pulmonary/Chest: Effort normal and breath sounds normal. No respiratory distress. She has no wheezes.  Abdominal: Soft. There is no tenderness. There is no rebound and no guarding.  Musculoskeletal: Normal range of motion. She exhibits edema and tenderness.        Mild swelling of left calf with tenderness over lateral aspect of calf.  Neurological: She is alert and oriented to person, place, and time.  Skin: Skin is warm and dry. No erythema.  Psychiatric: She has a normal mood and affect.    ED Course  Procedures (including critical care time)  Labs Reviewed  CBC - Abnormal; Notable for the following:    RBC 5.26 (*)    All other components within normal limits  BASIC METABOLIC PANEL - Abnormal; Notable for the following:    GFR calc non Af Amer 73 (*)    GFR calc Af Amer 85 (*)    All other components within normal limits  PROTIME-INR   No results found.   1. Deep venous thrombosis     1:25 PM Pt not yet in exam room.   2:24 PM Vascular tech report: Acute, occlusive, DVT in the L peroneal vein.   4:09 PM Lovenox and coumadin have been given in ED. Pt informed of test results and given copies to take to her PCP in [place]. Told to see PCP in two days. She verbalizes understanding and agrees with plan.    MDM  DVT, no PE symptoms and pt has IVC filter in place. Started on therapy today and she has PCP follow-up.         Carolee Rota, [place] 04/04/11 1610

## 2011-04-04 NOTE — Progress Notes (Signed)
50 year old female the history of DVT and was referred back to the emergency department because she had a positive Doppler test yesterday for DVT of the left lower leg. She is given prescriptions for Lovenox and Coumadin and arrangements are made for outpatient followup.

## 2011-04-04 NOTE — Progress Notes (Signed)
ANTICOAGULATION CONSULT NOTE - Initial Consult  Pharmacy Consult for Lovenox  Indication: DVT  Allergies  Allergen Reactions  . Amoxicillin Hives and Swelling  . Aspirin Nausea And Vomiting  . Penicillins Hives and Swelling    Patient Measurements: Height: 5' 6.5" (168.9 cm) Weight: 270 lb (122.471 kg) IBW/kg (Calculated) : 60.45  Adjusted Body Weight:   Vital Signs: Temp: 98.1 F (36.7 C) (11/24 1311) Temp src: Oral (11/24 1311) BP: 126/81 mmHg (11/24 1410) Pulse Rate: 51  (11/24 1410)  Labs: No results found for this basename: HGB:2,HCT:3,PLT:3,APTT:3,LABPROT:3,INR:3,HEPARINUNFRC:3,CREATININE:3,CKTOTAL:3,CKMB:3,TROPONINI:3 in the last 72 hours Estimated Creatinine Clearance: 106.5 ml/min (by C-G formula based on Cr of 0.87).  Medical History: Past Medical History  Diagnosis Date  . DVT (deep vein thrombosis) in pregnancy   . Hypertension   . CHF (congestive heart failure)   . Bipolar 1 disorder     Medications:  PTA: albuterol, amlodipine, atenolol, tums, hygroton, lorcet, lisinopril, KCL, Zoloft   Assessment: Ms. Freund is being assessed for an acute, occlusive, DVT in the L peroneal vein. Received orders to start full dose Lovenox per pharmacy. Noted the patient has been on Coumadin in the past (>1 year ago) for PE.   Goal of Therapy:  Anti Xa level 0.6-1.2 iu/ml at Css   Plan:  1. Will obtain INR, CBC and BMET now. 2. Will send Lovenox 125mg  SQ to ED now for STAT administration. 3. Will order Lovenox 125mg  SQ q 12h starting tomorrow, and follow-up to adjust if needed based on Scr. SCr/GFR in Oct was WNL. 4. Will continue Lovenox until INR >2 plus overlap for 48h.  Merit Maybee K. Allena Katz, PharmD, BCPS.  Clinical Pharmacist Pager [phone]. 04/04/2011 2:59 PM

## 2011-04-04 NOTE — ED Notes (Signed)
Pt requested to administer lovenox injectionm herself.  Pt was able to give verbal instructions for giving lovenox, then administerdd injection into her abdomen without difficulty.

## 2011-04-04 NOTE — ED Notes (Signed)
Lt. Leg pain x 1 week. Was on coumadin for clots. Went to dr. Magdalene Molly., and had dvt study and told her to come here further evaluation.

## 2011-06-03 ENCOUNTER — Inpatient Hospital Stay (HOSPITAL_COMMUNITY): Payer: Medicaid - Out of State

## 2011-06-03 ENCOUNTER — Encounter (HOSPITAL_COMMUNITY): Payer: Self-pay | Admitting: *Deleted

## 2011-06-03 ENCOUNTER — Inpatient Hospital Stay (HOSPITAL_COMMUNITY)
Admission: AD | Admit: 2011-06-03 | Discharge: 2011-06-04 | Disposition: A | Discharge status: Home / Self Care | Payer: Medicaid - Out of State | Source: Ambulatory Visit | Attending: Obstetrics & Gynecology | Admitting: Obstetrics & Gynecology

## 2011-06-03 DIAGNOSIS — N200 Calculus of kidney: Secondary | ICD-10-CM

## 2011-06-03 DIAGNOSIS — R1031 Right lower quadrant pain: Secondary | ICD-10-CM | POA: Insufficient documentation

## 2011-06-03 DIAGNOSIS — I10 Essential (primary) hypertension: Secondary | ICD-10-CM | POA: Insufficient documentation

## 2011-06-03 DIAGNOSIS — N949 Unspecified condition associated with female genital organs and menstrual cycle: Secondary | ICD-10-CM | POA: Insufficient documentation

## 2011-06-03 DIAGNOSIS — N938 Other specified abnormal uterine and vaginal bleeding: Secondary | ICD-10-CM | POA: Insufficient documentation

## 2011-06-03 DIAGNOSIS — N201 Calculus of ureter: Secondary | ICD-10-CM | POA: Insufficient documentation

## 2011-06-03 HISTORY — DX: Depression, unspecified: F32.A

## 2011-06-03 HISTORY — DX: Major depressive disorder, single episode, unspecified: F32.9

## 2011-06-03 HISTORY — DX: Acute bronchiolitis, unspecified: J21.9

## 2011-06-03 HISTORY — DX: Sleep apnea, unspecified: G47.30

## 2011-06-03 LAB — COMPREHENSIVE METABOLIC PANEL
BUN: 14 mg/dL (ref 6–23)
CO2: 25 mEq/L (ref 19–32)
Chloride: 106 mEq/L (ref 96–112)
Creatinine, Ser: 1.36 mg/dL — ABNORMAL HIGH (ref 0.50–1.10)
GFR calc non Af Amer: 45 mL/min — ABNORMAL LOW (ref >90)
Total Bilirubin: 0.3 mg/dL (ref 0.3–1.2)

## 2011-06-03 LAB — URINALYSIS, ROUTINE W REFLEX MICROSCOPIC
Bilirubin Urine: NEGATIVE
Glucose, UA: NEGATIVE mg/dL
Ketones, ur: 15 mg/dL — AB
Leukocytes, UA: NEGATIVE
pH: 6 (ref 5.0–8.0)

## 2011-06-03 LAB — URINE MICROSCOPIC-ADD ON

## 2011-06-03 LAB — CBC
HCT: 42.3 % (ref 36.0–46.0)
MCV: 82.3 fL (ref 78.0–100.0)
RDW: 14.3 % (ref 11.5–15.5)
WBC: 7.8 10*3/uL (ref 4.0–10.5)

## 2011-06-03 LAB — WET PREP, GENITAL
Trich, Wet Prep: NONE SEEN
Yeast Wet Prep HPF POC: NONE SEEN

## 2011-06-03 LAB — POCT PREGNANCY, URINE: Preg Test, Ur: NEGATIVE

## 2011-06-03 MED ORDER — PROMETHAZINE HCL 25 MG PO TABS: 25.0000 mg | ORAL_TABLET | Freq: Four times a day (QID) | ORAL | Status: DC | PRN

## 2011-06-03 MED ORDER — HYDROMORPHONE HCL PF 1 MG/ML IJ SOLN
1.0000 mg | Freq: Once | INTRAMUSCULAR | Status: AC
  Administered 2011-06-03: 1 mg via INTRAVENOUS
  Filled 2011-06-03: qty 1

## 2011-06-03 MED ORDER — ONDANSETRON 8 MG PO TBDP
8.0000 mg | ORAL_TABLET | Freq: Once | ORAL | Status: AC
  Administered 2011-06-04: 8 mg via ORAL
  Filled 2011-06-03: qty 1

## 2011-06-03 MED ORDER — HYDROMORPHONE HCL PF 1 MG/ML IJ SOLN: 1.0000 mg | Freq: Once | INTRAMUSCULAR | Status: DC

## 2011-06-03 MED ORDER — LACTATED RINGERS IV SOLN
INTRAVENOUS | Status: DC
  Administered 2011-06-03: 21:00:00 via INTRAVENOUS

## 2011-06-03 MED ORDER — IOHEXOL 300 MG/ML  SOLN
100.0000 mL | Freq: Once | INTRAMUSCULAR | Status: AC | PRN
  Administered 2011-06-03: 100 mL via INTRAVENOUS

## 2011-06-03 MED ORDER — OXYCODONE-ACETAMINOPHEN 5-325 MG PO TABS: 1.0000 | ORAL_TABLET | ORAL | Status: DC | PRN

## 2011-06-03 MED ORDER — OXYCODONE-ACETAMINOPHEN 5-325 MG PO TABS
2.0000 | ORAL_TABLET | Freq: Once | ORAL | Status: AC
  Administered 2011-06-04: 2 via ORAL
  Filled 2011-06-03: qty 2

## 2011-06-03 NOTE — ED Provider Notes (Signed)
History     Chief Complaint  Patient presents with  . Abdominal Pain   HPI 51 y.o. [id number] with RLQ pain starting this morning, severe, constant, but waxes and wanes. Also had menstrual type cramping earlier today which was relieved with Motrin, Motrin did not affect the RLQ pain. + vaginal bleeding, heavy with clots earlier today, none now. Menstrual cycles irregular/frequent/heavy x last 4 months.     Past Medical History  Diagnosis Date  . DVT (deep vein thrombosis) in pregnancy   . Hypertension   . CHF (congestive heart failure)   . Bipolar 1 disorder   . Depression   . Bronchiolitis   . Sleep apnea     Past Surgical History  Procedure Date  . Cesarean section   . Cholecystectomy   . Carotid stent     No family history on file.  History  Substance Use Topics  . Smoking status: Current Everyday Smoker -- 0.2 packs/day  . Smokeless tobacco: Not on file  . Alcohol Use: No    Allergies:  Allergies  Allergen Reactions  . Amoxicillin Hives and Swelling  . Aspirin Nausea And Vomiting  . Penicillins Hives and Swelling    Prescriptions prior to admission  Medication Sig Dispense Refill  . albuterol (PROVENTIL HFA;VENTOLIN HFA) 108 (90 BASE) MCG/ACT inhaler Inhale 1 puff into the lungs every 4 (four) hours as needed. For wheezing or shortness of breath       . amLODipine (NORVASC) 10 MG tablet Take 10 mg by mouth daily.       Marland Kitchen atenolol (TENORMIN) 50 MG tablet Take 50 mg by mouth daily.        . calcium carbonate (TUMS - DOSED IN MG ELEMENTAL CALCIUM) 500 MG chewable tablet Chew 3 tablets by mouth daily as needed. For heartburn       . chlorthalidone (HYGROTON) 25 MG tablet Take 25 mg by mouth daily.        Marland Kitchen enoxaparin (LOVENOX) 30 MG/0.3ML SOLN Inject 1.2 mLs (120 mg total) into the skin every 12 (twelve) hours.  8.4 mL  0  . HYDROcodone-acetaminophen (LORCET) 10-650 MG per tablet Take 1 tablet by mouth every 6 (six) hours as needed. For pain       . lisinopril  (PRINIVIL,ZESTRIL) 40 MG tablet Take 40 mg by mouth 2 (two) times daily.        . potassium chloride (KLOR-CON) 20 MEQ packet Take 20 mEq by mouth.        . sertraline (ZOLOFT) 100 MG tablet Take 100 mg by mouth daily.        Marland Kitchen warfarin (COUMADIN) 5 MG tablet Take 1 tablet (5 mg total) by mouth daily.  5 tablet  0    Review of Systems  Constitutional: Negative.  Negative for fever and chills.  Respiratory: Negative.   Cardiovascular: Negative.   Gastrointestinal: Positive for nausea and abdominal pain. Negative for vomiting, diarrhea and constipation.  Genitourinary: Negative for dysuria, urgency, frequency, hematuria and flank pain.       Positive for vaginal bleeding  Musculoskeletal: Negative.   Neurological: Negative.   Psychiatric/Behavioral: Negative.    Physical Exam   Blood pressure 161/71, pulse 71, temperature 98 F (36.7 C), temperature source Oral, resp. rate 20, height 5' 5.5" (1.664 m), weight 264 lb (119.75 kg), last menstrual period 05/31/2011.  Physical Exam  Nursing note and vitals reviewed. Constitutional: She is oriented to person, place, and time. She appears well-developed and well-nourished. She appears  distressed.  HENT:  Head: Normocephalic and atraumatic.  Cardiovascular: Normal rate, regular rhythm and normal heart sounds.        Exam limited by body habitus   Respiratory: Effort normal and breath sounds normal. No respiratory distress.  GI: Soft. She exhibits no distension and no mass. There is tenderness (RUQ, RLQ (greatest in RLQ)). There is guarding. There is no rebound.       + psoas  Genitourinary: There is no rash or lesion on the right labia. There is no rash or lesion on the left labia. Uterus is not deviated, not enlarged, not fixed and not tender. Cervix exhibits no motion tenderness, no discharge and no friability. Right adnexum displays no mass, no tenderness and no fullness. Left adnexum displays no mass, no tenderness and no fullness. No  erythema, tenderness or bleeding around the vagina. No vaginal discharge found.       Exam limited by body habitus   Neurological: She is alert and oriented to person, place, and time.  Skin: Skin is warm and dry.  Psychiatric: She has a normal mood and affect.    MAU Course  Procedures Results for orders placed during the hospital encounter of 06/03/11 (from the past 24 hour(s))  URINALYSIS, ROUTINE W REFLEX MICROSCOPIC     Status: Abnormal   Collection Time   06/03/11  7:50 PM      Component Value Range   Color, Urine YELLOW  YELLOW    APPearance HAZY (*) CLEAR    Specific Gravity, Urine >1.030 (*) 1.005 - 1.030    pH 6.0  5.0 - 8.0    Glucose, UA NEGATIVE  NEGATIVE (mg/dL)   Hgb urine dipstick LARGE (*) NEGATIVE    Bilirubin Urine NEGATIVE  NEGATIVE    Ketones, ur 15 (*) NEGATIVE (mg/dL)   Protein, ur 30 (*) NEGATIVE (mg/dL)   Urobilinogen, UA 0.2  0.0 - 1.0 (mg/dL)   Nitrite NEGATIVE  NEGATIVE    Leukocytes, UA NEGATIVE  NEGATIVE   URINE MICROSCOPIC-ADD ON     Status: Abnormal   Collection Time   06/03/11  7:50 PM      Component Value Range   Squamous Epithelial / LPF FEW (*) RARE    WBC, UA 0-2  <3 (WBC/hpf)   RBC / HPF 11-20  <3 (RBC/hpf)   Crystals CA OXALATE CRYSTALS (*) NEGATIVE   WET PREP, GENITAL     Status: Abnormal   Collection Time   06/03/11  8:15 PM      Component Value Range   Yeast, Wet Prep NONE SEEN  NONE SEEN    Trich, Wet Prep NONE SEEN  NONE SEEN    Clue Cells, Wet Prep NONE SEEN  NONE SEEN    WBC, Wet Prep HPF POC FEW (*) NONE SEEN   POCT PREGNANCY, URINE     Status: Normal   Collection Time   06/03/11  8:40 PM      Component Value Range   Preg Test, Ur NEGATIVE    CBC     Status: Abnormal   Collection Time   06/03/11  8:50 PM      Component Value Range   WBC 7.8  4.0 - 10.5 (K/uL)   RBC 5.14 (*) 3.87 - 5.11 (MIL/uL)   Hemoglobin 13.9  12.0 - 15.0 (g/dL)   HCT 45.4  09.8 - 11.9 (%)   MCV 82.3  78.0 - 100.0 (fL)   MCH 27.0  26.0 - 34.0 (pg)  MCHC 32.9  30.0 - 36.0 (g/dL)   RDW 16.1  09.6 - 04.5 (%)   Platelets 226  150 - 400 (K/uL)  COMPREHENSIVE METABOLIC PANEL     Status: Abnormal   Collection Time   06/03/11  8:50 PM      Component Value Range   Sodium 139  135 - 145 (mEq/L)   Potassium 3.5  3.5 - 5.1 (mEq/L)   Chloride 106  96 - 112 (mEq/L)   CO2 25  19 - 32 (mEq/L)   Glucose, Bld 76  70 - 99 (mg/dL)   BUN 14  6 - 23 (mg/dL)   Creatinine, Ser 4.09 (*) 0.50 - 1.10 (mg/dL)   Calcium 81.1  8.4 - 10.5 (mg/dL)   Total Protein 7.7  6.0 - 8.3 (g/dL)   Albumin 3.9  3.5 - 5.2 (g/dL)   AST 24  0 - 37 (U/L)   ALT 18  0 - 35 (U/L)   Alkaline Phosphatase 60  39 - 117 (U/L)   Total Bilirubin 0.3  0.3 - 1.2 (mg/dL)   GFR calc non Af Amer 45 (*) >90 (mL/min)   GFR calc Af Amer 52 (*) >90 (mL/min)    Pain improved with Dilaudid 1 mg IV  Ct Abdomen Pelvis W Contrast  06/03/2011  *RADIOLOGY REPORT*  Clinical Data: Right-sided abdominal pain for 1 day.  CT ABDOMEN AND PELVIS WITH CONTRAST  Technique:  Multidetector CT imaging of the abdomen and pelvis was performed following the standard protocol during bolus administration of intravenous contrast.  Contrast: OMNIPAQUE IOHEXOL 300 MG/ML IV SOLN  Comparison: Pelvic ultrasound performed 02/25/2009  Findings: The visualized lung bases are clear.  The liver and spleen are unremarkable in appearance.  The patient is status post cholecystectomy, with clips noted at the gallbladder fossa.  The pancreas and adrenal glands are unremarkable.  There is very mild right-sided hydronephrosis, with diffuse right- sided perinephric stranding, and prominence of the right ureter to the level of an obstructing 7 x 5 mm stone at the distal right ureter, 3 cm proximal to the right vesicoureteral junction.  Given diffusely decreased enhancement of the right kidney, mild right renal enlargement, and diffuse soft tissue inflammation along the course of the right ureter, findings raise concern for underlying  pyelonephritis and ureteritis.  The left kidney is unremarkable in appearance.  No nonobstructing renal stones are identified.  No free fluid is identified.  The small bowel is unremarkable in appearance.  The stomach is filled with contrast and is within normal limits.  No acute vascular abnormalities are seen.  An IVC filter is noted in expected position, inferior to the renal veins.  The appendix normal in caliber, without evidence for appendicitis. The colon is unremarkable in appearance.  The bladder is mildly distended and grossly unremarkable in appearance.  The uterus is within normal limits.  The ovaries are relatively symmetric; no suspicious adnexal masses are seen.  No inguinal lymphadenopathy is seen.  No acute osseous abnormalities are identified.  There is chronic loss of the intervertebral disc space at L5-S1.  IMPRESSION:  1.  Very mild right-sided hydronephrosis, with an obstructing 7 x 5 mm stone at the distal right ureter, 3 cm proximal to the right vesicoureteral junction. 2.  Diffusely decreased right renal enhancement, with mild right renal enlargement and diffuse soft tissue inflammation along the right ureter, concerning for right-sided pyelonephritis and ureteritis.  Original Report Authenticated By: Tonia Ghent, M.D.   Assessment and Plan  1) 7X5 mm obstructing stone in right ureter - consulted with urologist on call, Dr. Jacquelyne Balint, states that if patient's pain is currently under control, can d/c home with nausea and pain meds and have call [facility] in AM for follow up. Rx Percocet 5/325 #30 and Phenergan.  2) Chronic HTN - BP elevated prior to d/c, pt states she is due for night time dose of BP meds, asymptomatic, pt states she will take meds as soon as she arrives home and f/u with PCP, precautions rev'd 3) DUB - f/u in GYN clinic  [facility] 06/03/2011, 8:30 PM

## 2011-06-03 NOTE — ED Notes (Signed)
N. Bascom Levels CNM at the bedside, aware of BP.  In to discuss plan of care.

## 2011-06-03 NOTE — Progress Notes (Signed)
C/o irregular and painful menses for past 4 months; heavy bleeding with large clots earlier today but is not bleeding now;

## 2011-07-09 ENCOUNTER — Encounter: Payer: Medicaid - Out of State | Admitting: Obstetrics and Gynecology

## 2011-08-10 ENCOUNTER — Other Ambulatory Visit: Payer: Self-pay

## 2011-08-10 ENCOUNTER — Emergency Department (HOSPITAL_COMMUNITY): Payer: Medicaid - Out of State

## 2011-08-10 ENCOUNTER — Emergency Department (HOSPITAL_COMMUNITY)
Admission: EM | Admit: 2011-08-10 | Discharge: 2011-08-10 | Disposition: A | Discharge status: Home / Self Care | Payer: Medicaid - Out of State | Attending: Emergency Medicine | Admitting: Emergency Medicine

## 2011-08-10 ENCOUNTER — Encounter (HOSPITAL_COMMUNITY): Payer: Self-pay | Admitting: Emergency Medicine

## 2011-08-10 DIAGNOSIS — I509 Heart failure, unspecified: Secondary | ICD-10-CM | POA: Insufficient documentation

## 2011-08-10 DIAGNOSIS — I10 Essential (primary) hypertension: Secondary | ICD-10-CM | POA: Insufficient documentation

## 2011-08-10 DIAGNOSIS — H538 Other visual disturbances: Secondary | ICD-10-CM | POA: Insufficient documentation

## 2011-08-10 DIAGNOSIS — M546 Pain in thoracic spine: Secondary | ICD-10-CM | POA: Insufficient documentation

## 2011-08-10 DIAGNOSIS — R51 Headache: Secondary | ICD-10-CM | POA: Insufficient documentation

## 2011-08-10 DIAGNOSIS — Z86718 Personal history of other venous thrombosis and embolism: Secondary | ICD-10-CM | POA: Insufficient documentation

## 2011-08-10 DIAGNOSIS — Z79899 Other long term (current) drug therapy: Secondary | ICD-10-CM | POA: Insufficient documentation

## 2011-08-10 DIAGNOSIS — R079 Chest pain, unspecified: Secondary | ICD-10-CM | POA: Insufficient documentation

## 2011-08-10 DIAGNOSIS — F3130 Bipolar disorder, current episode depressed, mild or moderate severity, unspecified: Secondary | ICD-10-CM | POA: Insufficient documentation

## 2011-08-10 LAB — BASIC METABOLIC PANEL
BUN: 12 mg/dL (ref 6–23)
CO2: 25 mEq/L (ref 19–32)
Calcium: 10.4 mg/dL (ref 8.4–10.5)
Chloride: 107 mEq/L (ref 96–112)
Creatinine, Ser: 0.8 mg/dL (ref 0.50–1.10)
GFR calc Af Amer: >90 mL/min (ref >90)
GFR calc non Af Amer: 86 mL/min — ABNORMAL LOW (ref >90)
Glucose, Bld: 92 mg/dL (ref 70–99)
Potassium: 3.7 mEq/L (ref 3.5–5.1)
Sodium: 141 mEq/L (ref 135–145)

## 2011-08-10 LAB — TROPONIN I
Troponin I: <0.3 ng/mL (ref <0.30)
Troponin I: <0.3 ng/mL (ref <0.30)

## 2011-08-10 LAB — CBC
Hemoglobin: 13.2 g/dL (ref 12.0–15.0)
RBC: 4.82 MIL/uL (ref 3.87–5.11)

## 2011-08-10 MED ORDER — MORPHINE SULFATE 4 MG/ML IJ SOLN
4.0000 mg | Freq: Once | INTRAMUSCULAR | Status: AC
  Administered 2011-08-10: 4 mg via INTRAVENOUS
  Filled 2011-08-10: qty 1

## 2011-08-10 MED ORDER — ASPIRIN 81 MG PO CHEW
243.0000 mg | CHEWABLE_TABLET | Freq: Once | ORAL | Status: AC
  Administered 2011-08-10: 243 mg via ORAL

## 2011-08-10 MED ORDER — ASPIRIN EC 325 MG PO TBEC
DELAYED_RELEASE_TABLET | ORAL | Status: AC
  Filled 2011-08-10: qty 1

## 2011-08-10 MED ORDER — ASPIRIN 325 MG PO TABS
325.0000 mg | ORAL_TABLET | Freq: Once | ORAL | Status: DC
  Filled 2011-08-10: qty 1

## 2011-08-10 MED ORDER — METOPROLOL TARTRATE 1 MG/ML IV SOLN
INTRAVENOUS | Status: AC
  Filled 2011-08-10: qty 5

## 2011-08-10 MED ORDER — ASPIRIN 81 MG PO CHEW
CHEWABLE_TABLET | ORAL | Status: AC
  Administered 2011-08-10: 243 mg via ORAL
  Filled 2011-08-10: qty 3

## 2011-08-10 MED ORDER — LORAZEPAM 2 MG/ML IJ SOLN
1.0000 mg | Freq: Once | INTRAMUSCULAR | Status: AC
  Administered 2011-08-10: 1 mg via INTRAVENOUS
  Filled 2011-08-10: qty 1

## 2011-08-10 MED ORDER — IOHEXOL 300 MG/ML  SOLN
100.0000 mL | Freq: Once | INTRAMUSCULAR | Status: AC | PRN
  Administered 2011-08-10: 100 mL via INTRAVENOUS

## 2011-08-10 NOTE — ED Notes (Signed)
Pt asked to go to the restroom.  When she stood up she stumbled a small amount.  RN assisted pt back to the bed and a bedside commode was brought to the patient.  Tech assisted pt with toileting
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
## 2011-08-10 NOTE — Discharge Instructions (Signed)
Chest Pain (Nonspecific) It is often hard to give a specific diagnosis for the cause of chest pain. There is always a chance that your pain could be related to something serious, such as a heart attack or a blood clot in the lungs. You need to follow up with your caregiver for further evaluation. CAUSES   Heartburn.   Pneumonia or bronchitis.   Anxiety or stress.   Inflammation around your heart (pericarditis) or lung (pleuritis or pleurisy).   A blood clot in the lung.   A collapsed lung (pneumothorax). It can develop suddenly on its own (spontaneous pneumothorax) or from injury (trauma) to the chest.   Shingles infection (herpes zoster virus).  The chest wall is composed of bones, muscles, and cartilage. Any of these can be the source of the pain.  The bones can be bruised by injury.   The muscles or cartilage can be strained by coughing or overwork.   The cartilage can be affected by inflammation and become sore (costochondritis).  DIAGNOSIS  Lab tests or other studies, such as X-rays, electrocardiography, stress testing, or cardiac imaging, may be needed to find the cause of your pain.  TREATMENT   Treatment depends on what may be causing your chest pain. Treatment may include:   Acid blockers for heartburn.   Anti-inflammatory medicine.   Pain medicine for inflammatory conditions.   Antibiotics if an infection is present.   You may be advised to change lifestyle habits. This includes stopping smoking and avoiding alcohol, caffeine, and chocolate.   You may be advised to keep your head raised (elevated) when sleeping. This reduces the chance of acid going backward from your stomach into your esophagus.   Most of the time, nonspecific chest pain will improve within 2 to 3 days with rest and mild pain medicine.  HOME CARE INSTRUCTIONS   If antibiotics were prescribed, take your antibiotics as directed. Finish them even if you start to feel better.   For the next few  days, avoid physical activities that bring on chest pain. Continue physical activities as directed.   Do not smoke.   Avoid drinking alcohol.   Only take over-the-counter or prescription medicine for pain, discomfort, or fever as directed by your caregiver.   Follow your caregiver's suggestions for further testing if your chest pain does not go away.   Keep any follow-up appointments you made. If you do not go to an appointment, you could develop lasting (chronic) problems with pain. If there is any problem keeping an appointment, you must call to reschedule.  SEEK MEDICAL CARE IF:   You think you are having problems from the medicine you are taking. Read your medicine instructions carefully.   Your chest pain does not go away, even after treatment.   You develop a rash with blisters on your chest.  SEEK IMMEDIATE MEDICAL CARE IF:   You have increased chest pain or pain that spreads to your arm, neck, jaw, back, or abdomen.   You develop shortness of breath, an increasing cough, or you are coughing up blood.   You have severe back or abdominal pain, feel nauseous, or vomit.   You develop severe weakness, fainting, or chills.   You have a fever.  THIS IS AN EMERGENCY. Do not wait to see if the pain will go away. Get medical help at once. Call your local emergency services (911 in [place] Do not drive yourself to the hospital. MAKE SURE YOU:   Understand these instructions.     Will watch your condition.   Will get help right away if you are not doing well or get worse.  Document Released: 02/04/2005 Document Revised: 04/16/2011 Document Reviewed: 12/01/2007 [employer] Patient Information 2012 [employer]. 
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
## 2011-08-10 NOTE — ED Notes (Signed)
Patient transported to CT 

## 2011-08-10 NOTE — ED Notes (Signed)
Pt reports taking xarelto about two months ago and she noticed blood in her urine.  Pt was told by her doctor to discontinue taking medication.  She has an appointment on Thursday with her PCM to get started on coumadin due to DVT.

## 2011-08-10 NOTE — ED Provider Notes (Signed)
History     CSN: [id number]  Arrival date & time 08/10/11  4098   First MD Initiated Contact with Patient 08/10/11 0140      Chief Complaint  Patient presents with  . Headache  . Blurred Vision     The history is provided by the patient.   the patient reports that she developed a headache and blurred vision approximately 12 hours prior to arrival.  The patient and got into a verbal argument with her son and began having some chest discomfort with radiation to left arm.  On EMS arrival the patient's blood pressure was 280/180 she was given 1 sublingual nitroglycerin and 5 mg of metoprolol with improvement in her discomfort in her chest.  She has a history of DVT and pulmonary embolism) and see and used to be on Coumadin but she had this stopped by her doctor.  She also has a history of congestive heart failure although no known diagnoses of coronary artery disease.  She has hypertension as well as bipolar disorder.  She has no chest pain at this time.  She's had no recent fevers or chills.  She reports compliance with her medications.  She is currently on Norvasc atenolol and clonidine for her blood pressure control.  She does report mild pain in her left upper back.  She has no numbness or tingling.  She has no weakness of her upper or lower extremities.  Nothing worsens her symptoms.  Nothing improves her symptoms.  Her symptoms are improving and are mild in severity  Past Medical History  Diagnosis Date  . DVT (deep vein thrombosis) in pregnancy   . Hypertension   . CHF (congestive heart failure)   . Bipolar 1 disorder   . Depression   . Bronchiolitis   . Sleep apnea     Past Surgical History  Procedure Date  . Cesarean section   . Cholecystectomy   . Carotid stent     No family history on file.  History  Substance Use Topics  . Smoking status: Current Everyday Smoker -- 0.2 packs/day  . Smokeless tobacco: Not on file  . Alcohol Use: No    OB History    Grav Para Term  Preterm Abortions TAB SAB Ect Mult Living   4 3 3  1  1   3       Review of Systems  All other systems reviewed and are negative.    Allergies  Amoxicillin; Aspirin; Penicillins; Rivaroxaban; and Zofran  Home Medications   Current Outpatient Rx  Name Route Sig Dispense Refill  . AMLODIPINE BESYLATE 10 MG PO TABS Oral Take 10 mg by mouth daily.     . ATENOLOL 50 MG PO TABS Oral Take 50 mg by mouth daily.      Marland Kitchen CLONAZEPAM 0.5 MG PO TABS Oral Take 0.5 mg by mouth 2 (two) times daily.    Marland Kitchen CLONIDINE HCL 0.2 MG PO TABS Oral Take 0.2 mg by mouth 2 (two) times daily.    Marland Kitchen HYDROCODONE-ACETAMINOPHEN 10-650 MG PO TABS Oral Take 1 tablet by mouth every 6 (six) hours as needed. For pain     . IBUPROFEN 200 MG PO TABS Oral Take 400 mg by mouth every 6 (six) hours as needed. For pain.    Marland Kitchen POTASSIUM CHLORIDE CRYS ER 20 MEQ PO TBCR Oral Take 20 mEq by mouth 2 (two) times daily.    Marland Kitchen PROMETHAZINE HCL 25 MG PO TABS Oral Take 25 mg by mouth  every 6 (six) hours as needed. For nausea    . SERTRALINE HCL 100 MG PO TABS Oral Take 100 mg by mouth daily.      Marland Kitchen PROMETHAZINE HCL 25 MG PO TABS Oral Take 1 tablet (25 mg total) by mouth every 6 (six) hours as needed for nausea. 60 tablet 0    BP 159/98  Pulse 65  Temp 98.2 F (36.8 C)  Resp 11  SpO2 99%  Physical Exam  Nursing note and vitals reviewed. Constitutional: She is oriented to person, place, and time. She appears well-developed and well-nourished. No distress.  HENT:  Head: Normocephalic and atraumatic.  Eyes: EOM are normal. Pupils are equal, round, and reactive to light.  Neck: Normal range of motion.  Cardiovascular: Normal rate, regular rhythm and normal heart sounds.   Pulmonary/Chest: Effort normal and breath sounds normal.  Abdominal: Soft. She exhibits no distension. There is no tenderness.  Musculoskeletal: Normal range of motion.  Neurological: She is alert and oriented to person, place, and time.       5/5 strength in major  muscle groups of  bilateral upper and lower extremities. Speech normal. No facial asymetry.   Skin: Skin is warm and dry.  Psychiatric: She has a normal mood and affect. Judgment normal.    ED Course  Procedures (including critical care time)   Date: 08/10/2011  Rate: 66  Rhythm: normal sinus rhythm  QRS Axis: normal  Intervals: normal  ST/T Wave abnormalities: Nonspecific ST and T wave changes  Conduction Disutrbances: LVH with nonspecific ST changes secondary repolarization abnormality  Narrative Interpretation:   Old EKG Reviewed: T-wave inversions in V5 and V6 since prior EKG in October 2012      Labs Reviewed  BASIC METABOLIC PANEL - Abnormal; Notable for the following:    GFR calc non Af Amer 86 (*)    All other components within normal limits  CBC  TROPONIN I  TROPONIN I   Dg Chest 2 View  08/10/2011  *RADIOLOGY REPORT*  Clinical Data: Headache and blurred vision since yesterday.  CHEST - 2 VIEW  Comparison: 11/16/2007  Findings: Shallow inspiration.  Heart size and pulmonary vascularity are normal for technique.  Tortuous aorta.  No focal airspace consolidation in the lungs.  No blunting of costophrenic angles.  No pneumothorax.  Degenerative changes in the spine.  No significant change since prior study.  IMPRESSION: No evidence of active pulmonary disease.  Original Report Authenticated By: Marlon Pel, M.D.   Ct Head Wo Contrast  08/10/2011  *RADIOLOGY REPORT*  Clinical Data: New onset headache and chest pain  CT HEAD WITHOUT CONTRAST  Technique:  Contiguous axial images were obtained from the base of the skull through the vertex without contrast.  Comparison: 02/15/2011  Findings: Ventricles and sulci appear symmetrical.  No mass effect or midline shift.  No abnormal extra-axial fluid collections.  Wallace Cullens- white matter junctions are distinct.  Basal cisterns are not effaced.  No evidence of acute intracranial hemorrhage.  No depressed skull fractures.  Visualized  paranasal sinuses and mastoid air cells are not opacified.  No significant change since previous study.  IMPRESSION: No acute intracranial abnormalities identified.  Original Report Authenticated By: Marlon Pel, M.D.   Ct Angio Chest W/cm &/or Wo Cm  08/10/2011  *RADIOLOGY REPORT*  Clinical Data:  Chest pain extending down arms.  High blood pressure.  Back pain.  Evaluate for aortic dissection.  CT ANGIOGRAPHY CHEST AND ABDOMEN  Technique:  Multidetector CT  imaging of the chest and abdomen was performed using the standard protocol during bolus administration of intravenous contrast.  Multiplanar CT image reconstructions including MIPs were obtained to evaluate the vascular anatomy.  Contrast:  100 ml Omnipaque-300.  Comparison:  04/23/2005 CT chest.  06/03/2011 CT abdomen.  CTA CHEST  Findings:  Cardiac motion artifact ascending thoracic aorta without findings of thoracic aortic dissection.  The examination was not optimized to evaluate for pulmonary embolus.  Although no large central pulmonary embolus is noted, it would be difficult to exclude pulmonary embolus in branch vessels (particularly right upper lobe and left lower lobe) given the technique.  Cardiomegaly.  No pericardial effusion.  No bony destructive lesion.  No focal lung consolidation.   Review of the MIP images confirms the above findings.  IMPRESSION: No thoracic aortic dissection noted as discussed above.  Cardiomegaly  Limited for evaluation of pulmonary embolus as pulmonary embolus protocol was not utilized.  CTA ABDOMEN  Findings:  No abdominal aortic dissection or aneurysm.  Inferior vena cava filter is in place.  The struts extend towards the aorta.  No obvious aortic injury.  Arterial phase imaging without focal hepatic, splenic, pancreatic, adrenal or renal lesion.  Previously noted right-sided hydronephrosis has cleared.  Post cholecystectomy.  Fatty infiltration of the liver suspected.  Anterior abdominal wall hernia/diastasis of  the rectus muscle.  No upper abdominal inflammatory process or free interperitoneal air. Decompressed stomach with limit evaluation.  No bony destructive lesion.   Review of the MIP images confirms the above findings.  IMPRESSION: No abdominal aortic dissection or aneurysm.  Fatty infiltration of the liver.  Clearing of previously noted right-sided hydronephrosis.  Original Report Authenticated By: Fuller Canada, M.D.   Ct Angio Abdomen W/cm &/or Wo Contrast  08/10/2011  *RADIOLOGY REPORT*  Clinical Data:  Chest pain extending down arms.  High blood pressure.  Back pain.  Evaluate for aortic dissection.  CT ANGIOGRAPHY CHEST AND ABDOMEN  Technique:  Multidetector CT imaging of the chest and abdomen was performed using the standard protocol during bolus administration of intravenous contrast.  Multiplanar CT image reconstructions including MIPs were obtained to evaluate the vascular anatomy.  Contrast:  100 ml Omnipaque-300.  Comparison:  04/23/2005 CT chest.  06/03/2011 CT abdomen.  CTA CHEST  Findings:  Cardiac motion artifact ascending thoracic aorta without findings of thoracic aortic dissection.  The examination was not optimized to evaluate for pulmonary embolus.  Although no large central pulmonary embolus is noted, it would be difficult to exclude pulmonary embolus in branch vessels (particularly right upper lobe and left lower lobe) given the technique.  Cardiomegaly.  No pericardial effusion.  No bony destructive lesion.  No focal lung consolidation.   Review of the MIP images confirms the above findings.  IMPRESSION: No thoracic aortic dissection noted as discussed above.  Cardiomegaly  Limited for evaluation of pulmonary embolus as pulmonary embolus protocol was not utilized.  CTA ABDOMEN  Findings:  No abdominal aortic dissection or aneurysm.  Inferior vena cava filter is in place.  The struts extend towards the aorta.  No obvious aortic injury.  Arterial phase imaging without focal hepatic, splenic,  pancreatic, adrenal or renal lesion.  Previously noted right-sided hydronephrosis has cleared.  Post cholecystectomy.  Fatty infiltration of the liver suspected.  Anterior abdominal wall hernia/diastasis of the rectus muscle.  No upper abdominal inflammatory process or free interperitoneal air. Decompressed stomach with limit evaluation.  No bony destructive lesion.   Review of the MIP images  confirms the above findings.  IMPRESSION: No abdominal aortic dissection or aneurysm.  Fatty infiltration of the liver.  Clearing of previously noted right-sided hydronephrosis.  Original Report Authenticated By: Fuller Canada, M.D.   I personally reviewed the images  1. Headache   2. Chest pain   3. Hypertension       MDM  The patient had a heart catheterization in 2006 that demonstrated no coronary artery disease.  In the setting of hypertension that was 280/180 this may have resulted in lateral T wave inversions from strain.  Given her chest pain in left upper back pain a CT angiogram is done to evaluate for aortic dissection.  She feels much better at this time.  Her troponin x2 is normal.  Her EKG was repeated and still demonstrates a lateral T wave inversions however with no coronary disease in 2006 is unlikely that she develop obstructive disease in the past 6 years and her CT angiogram demonstrates no dissection.  She does not give any medications for blood pressure here in the ER and when she was here and calm down some her blood pressure came down to 158/70.  She feels much better will followup with her doctor in [place].  She understands the importance of developing a relationship with a primary care doctor in [facility].  She understands to return to the emergency department for new or worsening symptoms        Lyanne Co, MD 08/10/11 [phone]

## 2011-08-10 NOTE — ED Notes (Signed)
Pt complaining of left sided chest pain since a verbal argument ensued this morning.  Pt describes pain as sharp, shooting, and radiating down her left arm.  Pain is reproducible with exertion and hurts to breathe.

## 2011-11-14 ENCOUNTER — Encounter (HOSPITAL_COMMUNITY): Payer: Self-pay | Admitting: Nurse Practitioner

## 2011-11-14 ENCOUNTER — Emergency Department (HOSPITAL_COMMUNITY)
Admission: EM | Admit: 2011-11-14 | Discharge: 2011-11-14 | Disposition: A | Discharge status: Home / Self Care | Payer: Medicaid - Out of State | Attending: Emergency Medicine | Admitting: Emergency Medicine

## 2011-11-14 DIAGNOSIS — Z86718 Personal history of other venous thrombosis and embolism: Secondary | ICD-10-CM | POA: Insufficient documentation

## 2011-11-14 DIAGNOSIS — I10 Essential (primary) hypertension: Secondary | ICD-10-CM | POA: Insufficient documentation

## 2011-11-14 DIAGNOSIS — F319 Bipolar disorder, unspecified: Secondary | ICD-10-CM | POA: Insufficient documentation

## 2011-11-14 DIAGNOSIS — M79651 Pain in right thigh: Secondary | ICD-10-CM

## 2011-11-14 DIAGNOSIS — G4730 Sleep apnea, unspecified: Secondary | ICD-10-CM | POA: Insufficient documentation

## 2011-11-14 DIAGNOSIS — I509 Heart failure, unspecified: Secondary | ICD-10-CM | POA: Insufficient documentation

## 2011-11-14 DIAGNOSIS — F17200 Nicotine dependence, unspecified, uncomplicated: Secondary | ICD-10-CM | POA: Insufficient documentation

## 2011-11-14 DIAGNOSIS — Z9089 Acquired absence of other organs: Secondary | ICD-10-CM | POA: Insufficient documentation

## 2011-11-14 DIAGNOSIS — M79609 Pain in unspecified limb: Secondary | ICD-10-CM | POA: Insufficient documentation

## 2011-11-14 MED ORDER — HYDROMORPHONE HCL PF 1 MG/ML IJ SOLN
1.0000 mg | Freq: Once | INTRAMUSCULAR | Status: AC
  Administered 2011-11-14: 1 mg via INTRAMUSCULAR
  Filled 2011-11-14: qty 1

## 2011-11-14 MED ORDER — OXYCODONE-ACETAMINOPHEN 5-325 MG PO TABS: 1.0000 | ORAL_TABLET | ORAL | Status: DC | PRN

## 2011-11-14 NOTE — ED Notes (Signed)
Per ems: pt was having stent placed in R leg 3 months ago in [place] and the doctor stopped during the procedure and did not finish. C/o intermittent pain since the procedure. States pain is getting worse each time it comes. Cms intact. Pt reports she did take her BP meds this am

## 2011-11-14 NOTE — ED Provider Notes (Signed)
History     CSN: [id number]  Arrival date & time 11/14/11  1747   First MD Initiated Contact with Patient 11/14/11 1816      Chief Complaint  Patient presents with  . Leg Pain    (Consider location/radiation/quality/duration/timing/severity/associated sxs/prior treatment) The history is provided by the patient.   the patient reports 3 months of intermittent right lateral thigh pain.  She reports the pain is located in her right lateral thigh and does not radiate anywhere.  She denies fevers or chills.  She's had no leg swelling.  She does have a history of DVT and is on Coumadin.  She reports that her symptoms began shortly after a surgeon in [place] placed a stent in her leg in and pulled her back out.  It's unclear what this procedure was the patient is unable to explain it any further.  It's unclear if this was an issue regarding peripheral vascular disease and claudication or whether or not this was some other procedure.  She denies weakness or numbness in her lower extremities.  She has no other complaints.  She reports her pain is moderate.  Past Medical History  Diagnosis Date  . DVT (deep vein thrombosis) in pregnancy   . Hypertension   . CHF (congestive heart failure)   . Bipolar 1 disorder   . Depression   . Bronchiolitis   . Sleep apnea     Past Surgical History  Procedure Date  . Cesarean section   . Cholecystectomy   . Carotid stent     History reviewed. No pertinent family history.  History  Substance Use Topics  . Smoking status: Current Everyday Smoker -- 0.2 packs/day  . Smokeless tobacco: Not on file  . Alcohol Use: No    OB History    Grav Para Term Preterm Abortions TAB SAB Ect Mult Living   4 3 3  1  1   3       Review of Systems  All other systems reviewed and are negative.    Allergies  Amoxicillin; Aspirin; Penicillins; Rivaroxaban; and Xanax  Home Medications   Current Outpatient Rx  Name Route Sig Dispense Refill  . ASPIRIN EC 81  MG PO TBEC Oral Take 81 mg by mouth every 6 (six) hours as needed. For leg pain    . CLONAZEPAM 0.5 MG PO TABS Oral Take 0.5 mg by mouth 2 (two) times daily.    Marland Kitchen CLONIDINE HCL 0.2 MG PO TABS Oral Take 0.2 mg by mouth 2 (two) times daily.    Marland Kitchen HYDROCODONE-ACETAMINOPHEN 10-650 MG PO TABS Oral Take 1 tablet by mouth every 6 (six) hours as needed. For pain     . IBUPROFEN 200 MG PO TABS Oral Take 400 mg by mouth every 6 (six) hours as needed. For pain.    Marland Kitchen LISINOPRIL 40 MG PO TABS Oral Take 40 mg by mouth daily.    Marland Kitchen POTASSIUM CHLORIDE CRYS ER 20 MEQ PO TBCR Oral Take 20 mEq by mouth 2 (two) times daily.    . SERTRALINE HCL 100 MG PO TABS Oral Take 100 mg by mouth daily.     . WARFARIN SODIUM 5 MG PO TABS Oral Take 7.5 mg by mouth daily.    Marland Kitchen PROMETHAZINE HCL 25 MG PO TABS Oral Take 1 tablet (25 mg total) by mouth every 6 (six) hours as needed for nausea. 60 tablet 0    BP 188/96  Pulse 55  Temp 98.9 F (37.2 C) (Oral)  Resp 18  Ht 5\' 5"  (1.651 m)  Wt 260 lb (117.935 kg)  BMI 43.27 kg/m2  SpO2 100%  Physical Exam  Nursing note and vitals reviewed. Constitutional: She is oriented to person, place, and time. She appears well-developed and well-nourished. No distress.  HENT:  Head: Normocephalic and atraumatic.  Eyes: EOM are normal.  Neck: Normal range of motion.  Cardiovascular: Normal rate, regular rhythm and normal heart sounds.   Pulmonary/Chest: Effort normal and breath sounds normal.  Abdominal: Soft. She exhibits no distension. There is no tenderness.  Musculoskeletal: Normal range of motion.       Patient with no swelling of her bilateral lower extremities.  There is no erythema warmth in her right thigh.  She has normal pulses in her right foot.  She has full range of motion of her bilateral ankles knees and hips.  Neurological: She is alert and oriented to person, place, and time.  Skin: Skin is warm and dry.  Psychiatric: She has a normal mood and affect. Judgment normal.      ED Course  Procedures (including critical care time)  Labs Reviewed - No data to display No results found.   No diagnosis found.    MDM  This may represent inflammation of her lateral cutaneous nerve as the majority of her pain is in her right lateral thigh.  His no signs of infection at this time.  There is nothing to suggest this deep vein thromboses.  The patient be discharged home with pain medicine and close PCP followup.  She has a history of DVT and still on Coumadin at this time        Lyanne Co, MD 11/14/11 2139

## 2011-11-20 ENCOUNTER — Emergency Department (HOSPITAL_COMMUNITY)
Admission: EM | Admit: 2011-11-20 | Discharge: 2011-11-20 | Disposition: A | Discharge status: Home / Self Care | Payer: Medicaid - Out of State | Attending: Emergency Medicine | Admitting: Emergency Medicine

## 2011-11-20 ENCOUNTER — Encounter (HOSPITAL_COMMUNITY): Payer: Self-pay | Admitting: Emergency Medicine

## 2011-11-20 ENCOUNTER — Emergency Department (HOSPITAL_COMMUNITY): Payer: Medicaid - Out of State

## 2011-11-20 DIAGNOSIS — I10 Essential (primary) hypertension: Secondary | ICD-10-CM | POA: Insufficient documentation

## 2011-11-20 DIAGNOSIS — G4730 Sleep apnea, unspecified: Secondary | ICD-10-CM | POA: Insufficient documentation

## 2011-11-20 DIAGNOSIS — I509 Heart failure, unspecified: Secondary | ICD-10-CM | POA: Insufficient documentation

## 2011-11-20 DIAGNOSIS — F319 Bipolar disorder, unspecified: Secondary | ICD-10-CM | POA: Insufficient documentation

## 2011-11-20 DIAGNOSIS — M79609 Pain in unspecified limb: Secondary | ICD-10-CM | POA: Insufficient documentation

## 2011-11-20 DIAGNOSIS — F17200 Nicotine dependence, unspecified, uncomplicated: Secondary | ICD-10-CM | POA: Insufficient documentation

## 2011-11-20 DIAGNOSIS — M5416 Radiculopathy, lumbar region: Secondary | ICD-10-CM

## 2011-11-20 LAB — CBC
HCT: 40.8 % (ref 36.0–46.0)
Hemoglobin: 13.9 g/dL (ref 12.0–15.0)
RDW: 14.9 % (ref 11.5–15.5)
WBC: 8.6 10*3/uL (ref 4.0–10.5)

## 2011-11-20 LAB — BASIC METABOLIC PANEL
BUN: 11 mg/dL (ref 6–23)
Chloride: 102 mEq/L (ref 96–112)
GFR calc Af Amer: 82 mL/min — ABNORMAL LOW (ref >90)
Potassium: 3.5 mEq/L (ref 3.5–5.1)

## 2011-11-20 MED ORDER — OXYCODONE-ACETAMINOPHEN 5-325 MG PO TABS: 1.0000 | ORAL_TABLET | ORAL | Status: AC | PRN

## 2011-11-20 MED ORDER — OXYCODONE-ACETAMINOPHEN 5-325 MG PO TABS
1.0000 | ORAL_TABLET | Freq: Once | ORAL | Status: AC
  Administered 2011-11-20: 1 via ORAL
  Filled 2011-11-20: qty 1

## 2011-11-20 MED ORDER — CYCLOBENZAPRINE HCL 5 MG PO TABS: 5.0000 mg | ORAL_TABLET | Freq: Three times a day (TID) | ORAL | Status: AC | PRN

## 2011-11-20 NOTE — ED Provider Notes (Signed)
History   This chart was scribed for Celene Kras, MD by Shari Heritage. The patient was seen in room TR08C/TR08C. Patient's care was started at 1634.     CSN: [id number]  Arrival date & time 11/20/11  1634   First MD Initiated Contact with Patient 11/20/11 1715      Chief Complaint  Patient presents with  . Leg Pain    (Consider location/radiation/quality/duration/timing/severity/associated sxs/prior treatment) Patient is a 51 y.o. female presenting with back pain. The history is provided by the patient. No language interpreter was used.  Back Pain  This is a chronic problem. The current episode started more than 1 week ago. The problem occurs constantly. The problem has not changed since onset.The pain is associated with no known injury. The pain is present in the lumbar spine. The quality of the pain is described as shooting. The pain radiates to the right thigh. The pain is severe. Associated symptoms include leg pain. Pertinent negatives include no chest pain, no fever, no headaches and no abdominal pain. She has tried analgesics for the symptoms. The treatment provided no relief. Risk factors include obesity.    Amber Vaughn is a 51 y.o. female who presents to the Emergency Department complaining of chronic, moderate to severe, right leg pain onset 1.5 months ago. Patient says that the pain shoots up her thigh into her lumbar region causing back pain. Patient recently moved to [facility] and had been followed by a physician in [place]. Patient says that she had a procedure done in [place] to open up a blood vessel. Patient has been taking Percocet to manage pain. Is currently on blood thinning medication. Patient with h/o blood clot, DVT, HTN, CHF, depression, bronchiolitis and sleep apnea. Patient with surgical h/o Cesarean section, cholecystectomy and carotid stent. Patient is a current everyday smoker (0.25 packs/day).   Past Medical History  Diagnosis Date  . DVT (deep vein thrombosis) in  pregnancy   . Hypertension   . CHF (congestive heart failure)   . Bipolar 1 disorder   . Depression   . Bronchiolitis   . Sleep apnea     Past Surgical History  Procedure Date  . Cesarean section   . Cholecystectomy   . Carotid stent     No family history on file.  History  Substance Use Topics  . Smoking status: Current Everyday Smoker -- 0.2 packs/day  . Smokeless tobacco: Not on file  . Alcohol Use: No    OB History    Grav Para Term Preterm Abortions TAB SAB Ect Mult Living   4 3 3  1  1   3       Review of Systems  Constitutional: Negative for fever.  HENT: Negative for neck pain.   Eyes: Negative for visual disturbance.  Respiratory: Negative for cough.   Cardiovascular: Negative for chest pain.  Gastrointestinal: Negative for abdominal pain.  Genitourinary: Negative for frequency.  Musculoskeletal: Positive for back pain.  Skin: Negative for rash.  Neurological: Negative for headaches.  Psychiatric/Behavioral: The patient is not nervous/anxious.     Allergies  Amoxicillin; Aspirin; Penicillins; Rivaroxaban; and Xanax  Home Medications   Current Outpatient Rx  Name Route Sig Dispense Refill  . CLONAZEPAM 0.5 MG PO TABS Oral Take 0.5 mg by mouth 2 (two) times daily.    Marland Kitchen CLONIDINE HCL 0.2 MG PO TABS Oral Take 0.2 mg by mouth 2 (two) times daily.    Marland Kitchen HYDROCODONE-ACETAMINOPHEN 10-650 MG PO TABS Oral Take 1  tablet by mouth every 6 (six) hours as needed. For pain     . IBUPROFEN 200 MG PO TABS Oral Take 400 mg by mouth every 6 (six) hours as needed. For pain.    Marland Kitchen LISINOPRIL 40 MG PO TABS Oral Take 40 mg by mouth 2 (two) times daily.     . OXYCODONE-ACETAMINOPHEN 5-325 MG PO TABS Oral Take 1 tablet by mouth every 4 (four) hours as needed for pain. 20 tablet 0  . POTASSIUM CHLORIDE CRYS ER 20 MEQ PO TBCR Oral Take 20 mEq by mouth 2 (two) times daily.    Marland Kitchen PROMETHAZINE HCL 25 MG PO TABS Oral Take 25 mg by mouth every 6 (six) hours as needed. For nausea    .  WARFARIN SODIUM 5 MG PO TABS Oral Take 7.5 mg by mouth daily.    Marland Kitchen PROMETHAZINE HCL 25 MG PO TABS Oral Take 1 tablet (25 mg total) by mouth every 6 (six) hours as needed for nausea. 60 tablet 0    BP 122/81  Pulse 59  Temp 98.2 F (36.8 C) (Oral)  Resp 14  SpO2 95%  Physical Exam  Nursing note and vitals reviewed. Constitutional: She appears well-developed and well-nourished. No distress.       Somnolent.   HENT:  Head: Normocephalic and atraumatic.  Right Ear: External ear normal.  Left Ear: External ear normal.  Eyes: Conjunctivae are normal. Right eye exhibits no discharge. Left eye exhibits no discharge. No scleral icterus.  Neck: Neck supple. No tracheal deviation present.  Cardiovascular: Normal rate.   Pulmonary/Chest: Effort normal. No stridor. No respiratory distress.  Musculoskeletal: She exhibits no edema.       Lumbar back: She exhibits tenderness (Paraspinal). She exhibits no swelling and no edema.       No edema or erythema of right leg. No right calf tenderness. Strong dorsalis pedis pulse in RLE. Full ROM RLE. 5/5 strength in RLE.  Neurological: She is alert. Cranial nerve deficit: no gross deficits.  Skin: Skin is warm and dry. No rash noted.  Psychiatric: She has a normal mood and affect.    ED Course  Procedures (including critical care time) DIAGNOSTIC STUDIES: Oxygen Saturation is 95% on room air, adequate by my interpretation.    COORDINATION OF CARE: 5:17pm- Patient informed of current plan for treatment and evaluation and agrees with plan at this time. Will order labs and X-ray of lumbar spine.   Results for orders placed during the hospital encounter of 11/20/11  CBC      Component Value Range   WBC 8.6  4.0 - 10.5 K/uL   RBC 5.03  3.87 - 5.11 MIL/uL   Hemoglobin 13.9  12.0 - 15.0 g/dL   HCT 40.9  81.1 - 91.4 %   MCV 81.1  78.0 - 100.0 fL   MCH 27.6  26.0 - 34.0 pg   MCHC 34.1  30.0 - 36.0 g/dL   RDW 78.2  95.6 - 21.3 %   Platelets 204  150 -  400 K/uL  PROTIME-INR      Component Value Range   Prothrombin Time 14.1  11.6 - 15.2 seconds   INR 1.07  0.00 - 1.49     Dg Lumbar Spine Complete  11/20/2011  *RADIOLOGY REPORT*  Clinical Data: Right-sided back pain radiating to the right leg with numbness and tingling.  LUMBAR SPINE - COMPLETE 4+ VIEW  Comparison: None.  Findings: L5 is transitional.  Alignment is anatomic.  No definite  pars defects.  Mild endplate degenerative changes from L2-3 to L4- 5.  Loss of disc space height at L4-5.  Probable facet hypertrophy as well at this level.  IVC filter is noted.  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  Original Report Authenticated By: Reyes Ivan, M.D.     No diagnosis found.    MDM  Pt has no edema or swelling in her leg to suggest recurrent DVT.  Her INR is subterhapeutic.  Will have her double her next dose and follow up with her PCP.  She has been somnolent in the ED from her pain medications.  I hesitate adding any additional narcotics.  Recc follow up with her PCP for symptoms most suggestive of sciatica.  At this time there does not appear to be any evidence of an acute emergency medical condition and the patient appears stable for discharge with appropriate outpatient follow up.       I personally performed the services described in this documentation, which was scribed in my presence.  The recorded information has been reviewed and considered.    Celene Kras, MD 11/20/11 [phone]

## 2011-11-20 NOTE — ED Notes (Signed)
Patient called for ride to pick her up, patient wheeled to lobby to wait for ride.

## 2011-11-20 NOTE — ED Notes (Addendum)
Pt to ED via [facility] for R leg pain x 1 1/2 months.  Pt falling asleep during triage exam.  States she last took pain medication this morning. No known injury per pt. See previous ED visit for more related to same.

## 2011-12-02 ENCOUNTER — Encounter (HOSPITAL_COMMUNITY): Payer: Self-pay

## 2011-12-02 ENCOUNTER — Emergency Department (HOSPITAL_COMMUNITY)
Admission: EM | Admit: 2011-12-02 | Discharge: 2011-12-02 | Disposition: A | Discharge status: Home / Self Care | Payer: Medicaid - Out of State | Attending: Emergency Medicine | Admitting: Emergency Medicine

## 2011-12-02 DIAGNOSIS — Z86718 Personal history of other venous thrombosis and embolism: Secondary | ICD-10-CM | POA: Insufficient documentation

## 2011-12-02 DIAGNOSIS — F17200 Nicotine dependence, unspecified, uncomplicated: Secondary | ICD-10-CM | POA: Insufficient documentation

## 2011-12-02 DIAGNOSIS — G5711 Meralgia paresthetica, right lower limb: Secondary | ICD-10-CM

## 2011-12-02 DIAGNOSIS — I509 Heart failure, unspecified: Secondary | ICD-10-CM | POA: Insufficient documentation

## 2011-12-02 DIAGNOSIS — IMO0002 Reserved for concepts with insufficient information to code with codable children: Secondary | ICD-10-CM | POA: Insufficient documentation

## 2011-12-02 DIAGNOSIS — G4730 Sleep apnea, unspecified: Secondary | ICD-10-CM | POA: Insufficient documentation

## 2011-12-02 DIAGNOSIS — F319 Bipolar disorder, unspecified: Secondary | ICD-10-CM | POA: Insufficient documentation

## 2011-12-02 DIAGNOSIS — I10 Essential (primary) hypertension: Secondary | ICD-10-CM | POA: Insufficient documentation

## 2011-12-02 MED ORDER — HYDROMORPHONE HCL PF 2 MG/ML IJ SOLN
2.0000 mg | Freq: Once | INTRAMUSCULAR | Status: AC
  Administered 2011-12-02: 2 mg via INTRAMUSCULAR
  Filled 2011-12-02: qty 1

## 2011-12-02 MED ORDER — HYDROMORPHONE HCL 2 MG PO TABS: 2.0000 mg | ORAL_TABLET | ORAL | Status: AC | PRN

## 2011-12-02 MED ORDER — ONDANSETRON 4 MG PO TBDP
ORAL_TABLET | ORAL | Status: AC
  Filled 2011-12-02: qty 2

## 2011-12-02 MED ORDER — ONDANSETRON 4 MG PO TBDP
8.0000 mg | ORAL_TABLET | Freq: Once | ORAL | Status: AC
  Administered 2011-12-02: 8 mg via ORAL

## 2011-12-02 NOTE — ED Notes (Signed)
Pt states that she is still in pain and can't do anything. Pt states she is nauseous and vomiting all the time. Pt states pain goes down back and down right leg to knee. Pt states that she also needs help finding a doctor.

## 2011-12-02 NOTE — ED Notes (Signed)
Per ems- pt c/o chronic back pain x3 mo. Pt told ems she has a slipped disc. Pt stated to ems that everyone is simply giving her pain medications and she wants her back fixed. Pt ambulatory, NAD noted.

## 2011-12-02 NOTE — ED Provider Notes (Signed)
History   This chart was scribed for Amber Guppy, MD by Charolett Bumpers . The patient was seen in room TR10C/TR10C. Patient's care was started at 1804.    CSN: [id number]  Arrival date & time 12/02/11  1713   First MD Initiated Contact with Patient 12/02/11 1804      Chief Complaint  Patient presents with  . Back Pain    (Consider location/radiation/quality/duration/timing/severity/associated sxs/prior treatment) HPI Amber Vaughn is a 51 y.o. female who presents to the Emergency Department via EMS complaining of constant, moderate right-sided lower back pain that radiates to right thigh for the past 3 months. Pt reports associated n/v. Pt states that her symptoms are aggravated with walking and movement. Pt states that she was seen here recently in ED and told she had a slipped disc and arthritis. Pt states that the pain is in the front of her right thigh and radiates to right side of lower back. Pt reports temporary relief with pain medication. Pt states that she is ambulatory.   Past Medical History  Diagnosis Date  . DVT (deep vein thrombosis) in pregnancy   . Hypertension   . CHF (congestive heart failure)   . Bipolar 1 disorder   . Depression   . Bronchiolitis   . Sleep apnea     Past Surgical History  Procedure Date  . Cesarean section   . Cholecystectomy   . Carotid stent     History reviewed. No pertinent family history.  History  Substance Use Topics  . Smoking status: Current Everyday Smoker -- 0.2 packs/day  . Smokeless tobacco: Not on file  . Alcohol Use: No    OB History    Grav Para Term Preterm Abortions TAB SAB Ect Mult Living   4 3 3  1  1   3       Review of Systems  Constitutional: Negative for fever and chills.  Respiratory: Negative for shortness of breath.   Gastrointestinal: Positive for nausea and vomiting.  Musculoskeletal: Positive for back pain.  Neurological: Negative for weakness.    Allergies  Amoxicillin;  Aspirin; Penicillins; Rivaroxaban; and Xanax  Home Medications   Current Outpatient Rx  Name Route Sig Dispense Refill  . CLONAZEPAM 0.5 MG PO TABS Oral Take 0.5 mg by mouth 2 (two) times daily.    Marland Kitchen CLONIDINE HCL 0.2 MG PO TABS Oral Take 0.2 mg by mouth 2 (two) times daily.    Marland Kitchen HYDROCODONE-ACETAMINOPHEN 10-650 MG PO TABS Oral Take 1 tablet by mouth every 6 (six) hours as needed. For pain     . LISINOPRIL 40 MG PO TABS Oral Take 40 mg by mouth 2 (two) times daily.     Marland Kitchen POTASSIUM CHLORIDE CRYS ER 20 MEQ PO TBCR Oral Take 20 mEq by mouth 2 (two) times daily.    Marland Kitchen PROMETHAZINE HCL 25 MG PO TABS Oral Take 25 mg by mouth every 6 (six) hours as needed. For nausea    . WARFARIN SODIUM 5 MG PO TABS Oral Take 7.5 mg by mouth daily.    Marland Kitchen PROMETHAZINE HCL 25 MG PO TABS Oral Take 1 tablet (25 mg total) by mouth every 6 (six) hours as needed for nausea. 60 tablet 0    BP 196/115  Pulse 65  Temp 98.1 F (36.7 C) (Oral)  Resp 18  SpO2 98%  LMP 11/09/2011  Physical Exam  Nursing note and vitals reviewed. Constitutional: She is oriented to person, place, and time. She appears well-developed  and well-nourished. No distress.       Morbidly obese.   HENT:  Head: Normocephalic and atraumatic.  Eyes: EOM are normal.  Neck: Normal range of motion. Neck supple. No tracheal deviation present.  Cardiovascular: Normal rate.   Pulmonary/Chest: Effort normal. No respiratory distress.  Musculoskeletal: Normal range of motion. She exhibits no edema.  Neurological: She is alert and oriented to person, place, and time. No sensory deficit.  Skin: Skin is warm and dry.  Psychiatric: She has a normal mood and affect. Her behavior is normal.    ED Course  Procedures (including critical care time) The patient is morbidly obese.  She describes excruciating pain in the anterior, lateral aspect of her right thigh.  No history of trauma.  No weakness or pain, radiating below.  The knee  DIAGNOSTIC  STUDIES: Oxygen Saturation is 98% on room air, normal by my interpretation.    COORDINATION OF CARE:  18:27-Discussed planned course of treatment with the patient including pain mediation, who is agreeable at this time.   18:30-Medication Orders: Hydromorphone (Dilaudid) injection 2 mg-once  Labs Reviewed - No data to display No results found.   No diagnosis found.    MDM  Neuralgia paresthetica   I personally performed the services described in this documentation, which was scribed in my presence. The recorded information has been reviewed and considered.        Amber Guppy, MD 12/02/11 [phone]

## 2011-12-02 NOTE — ED Notes (Signed)
Pt d/c home in NAD. pT voiced understanding of d/c instructions and follow up care. Pt instructed not to drive after taking dilaudid

## 2011-12-21 ENCOUNTER — Encounter (HOSPITAL_COMMUNITY): Payer: Self-pay | Admitting: *Deleted

## 2011-12-21 ENCOUNTER — Emergency Department (HOSPITAL_COMMUNITY)
Admission: EM | Admit: 2011-12-21 | Discharge: 2011-12-21 | Disposition: A | Discharge status: Home / Self Care | Payer: Medicaid Other | Attending: Emergency Medicine | Admitting: Emergency Medicine

## 2011-12-21 DIAGNOSIS — I10 Essential (primary) hypertension: Secondary | ICD-10-CM | POA: Insufficient documentation

## 2011-12-21 DIAGNOSIS — Z7901 Long term (current) use of anticoagulants: Secondary | ICD-10-CM | POA: Insufficient documentation

## 2011-12-21 DIAGNOSIS — Z86718 Personal history of other venous thrombosis and embolism: Secondary | ICD-10-CM | POA: Insufficient documentation

## 2011-12-21 DIAGNOSIS — Z79899 Other long term (current) drug therapy: Secondary | ICD-10-CM | POA: Insufficient documentation

## 2011-12-21 DIAGNOSIS — I2510 Atherosclerotic heart disease of native coronary artery without angina pectoris: Secondary | ICD-10-CM | POA: Insufficient documentation

## 2011-12-21 DIAGNOSIS — F329 Major depressive disorder, single episode, unspecified: Secondary | ICD-10-CM | POA: Insufficient documentation

## 2011-12-21 DIAGNOSIS — F17200 Nicotine dependence, unspecified, uncomplicated: Secondary | ICD-10-CM | POA: Insufficient documentation

## 2011-12-21 DIAGNOSIS — Z86711 Personal history of pulmonary embolism: Secondary | ICD-10-CM | POA: Insufficient documentation

## 2011-12-21 DIAGNOSIS — R112 Nausea with vomiting, unspecified: Secondary | ICD-10-CM

## 2011-12-21 DIAGNOSIS — I160 Hypertensive urgency: Secondary | ICD-10-CM

## 2011-12-21 DIAGNOSIS — F3289 Other specified depressive episodes: Secondary | ICD-10-CM | POA: Insufficient documentation

## 2011-12-21 DIAGNOSIS — I509 Heart failure, unspecified: Secondary | ICD-10-CM | POA: Insufficient documentation

## 2011-12-21 LAB — CBC WITH DIFFERENTIAL/PLATELET
Basophils Absolute: 0 10*3/uL (ref 0.0–0.1)
Eosinophils Absolute: 0 10*3/uL (ref 0.0–0.7)
Eosinophils Relative: 0 % (ref 0–5)
Lymphs Abs: 1.9 10*3/uL (ref 0.7–4.0)
MCH: 27.3 pg (ref 26.0–34.0)
MCHC: 33.7 g/dL (ref 30.0–36.0)
MCV: 81 fL (ref 78.0–100.0)
Platelets: 206 10*3/uL (ref 150–400)
RDW: 15.1 % (ref 11.5–15.5)

## 2011-12-21 LAB — BASIC METABOLIC PANEL
CO2: 23 mEq/L (ref 19–32)
Calcium: 10 mg/dL (ref 8.4–10.5)
Creatinine, Ser: 0.74 mg/dL (ref 0.50–1.10)
Glucose, Bld: 94 mg/dL (ref 70–99)

## 2011-12-21 LAB — TROPONIN I: Troponin I: <0.3 ng/mL (ref <0.30)

## 2011-12-21 LAB — URINE MICROSCOPIC-ADD ON

## 2011-12-21 LAB — URINALYSIS, ROUTINE W REFLEX MICROSCOPIC
Bilirubin Urine: NEGATIVE
Ketones, ur: NEGATIVE mg/dL
Nitrite: NEGATIVE
pH: 6 (ref 5.0–8.0)

## 2011-12-21 LAB — PROTIME-INR: Prothrombin Time: 13.7 seconds (ref 11.6–15.2)

## 2011-12-21 MED ORDER — ONDANSETRON HCL 4 MG PO TABS
ORAL_TABLET | ORAL | Status: AC
  Filled 2011-12-21: qty 2

## 2011-12-21 MED ORDER — SODIUM CHLORIDE 0.9 % IV SOLN
Freq: Once | INTRAVENOUS | Status: AC
  Administered 2011-12-21: 150 mL via INTRAVENOUS

## 2011-12-21 MED ORDER — CLONIDINE HCL 0.1 MG PO TABS
0.2000 mg | ORAL_TABLET | Freq: Once | ORAL | Status: AC
  Administered 2011-12-21: 0.2 mg via ORAL
  Filled 2011-12-21: qty 2

## 2011-12-21 MED ORDER — ONDANSETRON 8 MG PO TBDP: 8.0000 mg | ORAL_TABLET | Freq: Three times a day (TID) | ORAL | Status: AC | PRN

## 2011-12-21 MED ORDER — LISINOPRIL 20 MG PO TABS
20.0000 mg | ORAL_TABLET | Freq: Once | ORAL | Status: AC
  Administered 2011-12-21: 20 mg via ORAL
  Filled 2011-12-21 (×2): qty 1

## 2011-12-21 MED ORDER — ONDANSETRON HCL 4 MG PO TABS
8.0000 mg | ORAL_TABLET | Freq: Once | ORAL | Status: AC
  Administered 2011-12-21: 8 mg via ORAL
  Filled 2011-12-21: qty 2

## 2011-12-21 MED ORDER — ONDANSETRON HCL 4 MG/2ML IJ SOLN
4.0000 mg | Freq: Once | INTRAMUSCULAR | Status: AC
  Administered 2011-12-21: 4 mg via INTRAVENOUS
  Filled 2011-12-21: qty 2

## 2011-12-21 MED ORDER — HYDRALAZINE HCL 20 MG/ML IJ SOLN
10.0000 mg | Freq: Once | INTRAMUSCULAR | Status: AC
  Administered 2011-12-21: 10 mg via INTRAVENOUS
  Filled 2011-12-21: qty 0.5

## 2011-12-21 NOTE — ED Notes (Signed)
Pt reports having bloody stool x 4 month, told the pcp but her pcp didn't do anything about it. Pt admit to having hemorroid

## 2011-12-21 NOTE — ED Notes (Signed)
Pt states last night she took a pain pill last night and then 1 hr later started vomiting, states has been vomiting since 0100. Unable to tolerate po's. Pt denies complaints of pain. State she took pain medication last night because of right leg pain and she is scheduled to have surgery on that leg. Denies abd pain, cramping or diarrhea.

## 2011-12-21 NOTE — ED Provider Notes (Signed)
History     CSN: [id number]  Arrival date & time 12/21/11  1244   First MD Initiated Contact with Patient 12/21/11 1350      Chief Complaint  Patient presents with  . Nausea  . Emesis    (Consider location/radiation/quality/duration/timing/severity/associated sxs/prior treatment) HPI Comments: Pt with hx of CAD, HTN, DVT/PE on coumadin comes in with cc of nausea and emesis. Pt states that starting this morning, Pt has been having nausea, unable to keep food down. There is no abd pain, no chest pain, sob, f/c/diarrhea associated with that. Pt is passing flatus.Pt decided to come in as she was unable to tolerate po meds.  Patient is a 51 y.o. female presenting with vomiting. The history is provided by the patient.  Emesis  Pertinent negatives include no abdominal pain, no cough and no diarrhea.    Past Medical History  Diagnosis Date  . DVT (deep vein thrombosis) in pregnancy   . Hypertension   . CHF (congestive heart failure)   . Bipolar 1 disorder   . Depression   . Bronchiolitis   . Sleep apnea     Past Surgical History  Procedure Date  . Cesarean section   . Cholecystectomy   . Carotid stent     History reviewed. No pertinent family history.  History  Substance Use Topics  . Smoking status: Current Everyday Smoker -- 0.2 packs/day    Types: Cigarettes  . Smokeless tobacco: Not on file  . Alcohol Use: No    OB History    Grav Para Term Preterm Abortions TAB SAB Ect Mult Living   4 3 3  1  1   3       Review of Systems  Constitutional: Negative for activity change.  HENT: Negative for facial swelling and neck pain.   Respiratory: Negative for cough, chest tightness, shortness of breath and wheezing.   Cardiovascular: Negative for chest pain.  Gastrointestinal: Positive for nausea and vomiting. Negative for abdominal pain, diarrhea, constipation, blood in stool and abdominal distention.  Genitourinary: Negative for hematuria and difficulty urinating.    Skin: Negative for color change.  Neurological: Negative for speech difficulty.  Hematological: Does not bruise/bleed easily.  Psychiatric/Behavioral: Negative for confusion.    Allergies  Amoxicillin; Aspirin; Penicillins; Rivaroxaban; and Xanax  Home Medications   Current Outpatient Rx  Name Route Sig Dispense Refill  . CLONAZEPAM 0.5 MG PO TABS Oral Take 0.5 mg by mouth 2 (two) times daily.    Marland Kitchen CLONIDINE HCL 0.2 MG PO TABS Oral Take 0.2 mg by mouth 2 (two) times daily.    Marland Kitchen HYDROCODONE-ACETAMINOPHEN 10-650 MG PO TABS Oral Take 1 tablet by mouth every 6 (six) hours as needed. For pain     . LISINOPRIL 40 MG PO TABS Oral Take 40 mg by mouth 2 (two) times daily.     Marland Kitchen POTASSIUM CHLORIDE CRYS ER 20 MEQ PO TBCR Oral Take 20 mEq by mouth 2 (two) times daily.    Marland Kitchen PROMETHAZINE HCL 25 MG PO TABS Oral Take 25 mg by mouth every 6 (six) hours as needed. For nausea    . WARFARIN SODIUM 5 MG PO TABS Oral Take 7.5 mg by mouth daily.    Marland Kitchen ONDANSETRON 8 MG PO TBDP Oral Take 1 tablet (8 mg total) by mouth every 8 (eight) hours as needed for nausea. 20 tablet 0    BP 179/97  Pulse 76  Temp 98.4 F (36.9 C) (Oral)  Resp 16  SpO2  100%  LMP 12/17/2011  Physical Exam  Constitutional: She is oriented to person, place, and time. She appears well-developed and well-nourished.  HENT:  Head: Normocephalic and atraumatic.  Eyes: EOM are normal. Pupils are equal, round, and reactive to light.  Neck: Neck supple.  Cardiovascular: Normal rate, regular rhythm and normal heart sounds.   No murmur heard. Pulmonary/Chest: Effort normal. No respiratory distress.  Abdominal: Soft. She exhibits no distension. There is no tenderness. There is no rebound and no guarding.  Neurological: She is alert and oriented to person, place, and time.  Skin: Skin is warm and dry.    ED Course  Procedures (including critical care time)  Labs Reviewed  CBC WITH DIFFERENTIAL - Abnormal; Notable for the following:     RBC 5.20 (*)     All other components within normal limits  URINALYSIS, ROUTINE W REFLEX MICROSCOPIC - Abnormal; Notable for the following:    APPearance CLOUDY (*)     Protein, ur 100 (*)     Leukocytes, UA LARGE (*)     All other components within normal limits  PHOSPHORUS - Abnormal; Notable for the following:    Phosphorus 2.2 (*)     All other components within normal limits  URINE MICROSCOPIC-ADD ON - Abnormal; Notable for the following:    Squamous Epithelial / LPF FEW (*)     Bacteria, UA FEW (*)     All other components within normal limits  BASIC METABOLIC PANEL  TROPONIN I  MAGNESIUM  PROTIME-INR  APTT   No results found.   1. Nausea and vomiting in adult   2. Hypertensive urgency       MDM   Date: 12/21/2011  Rate:59  Rhythm: normal sinus rhythm  QRS Axis: normal  Intervals: normal  ST/T Wave abnormalities: nonspecific ST/T changes  Conduction Disutrbances:none  Narrative Interpretation:   Old EKG Reviewed: unchanged  Pt comes in with cc of nausea and emesis only. No associated chest pain, no abd pain. EKG is WNL. Symptoms started early in the morning. We will get trops x 2 Pt's HTN is elevated to 187/89 at my eval - will make sure there is no evidence of end organ damage. Pt will get po challenge, home meds when she is able to tolerate po.   6:43 PM Pt tolerating po Labs unremarkable. Pt feel better. Will give po meds and discharge.          Derwood Kaplan, MD 12/21/11 1843

## 2011-12-21 NOTE — ED Notes (Signed)
Per EMS> pt is from home with N/V since 1AM.  Pt has been taking pain pills that make her nauseated, but nausea typically goes away.  Pt denies abdominal pain.

## 2011-12-31 ENCOUNTER — Emergency Department (HOSPITAL_COMMUNITY)
Admission: EM | Admit: 2011-12-31 | Discharge: 2012-01-01 | Disposition: A | Discharge status: Home / Self Care | Payer: Medicaid Other | Attending: Emergency Medicine | Admitting: Emergency Medicine

## 2011-12-31 ENCOUNTER — Emergency Department (HOSPITAL_COMMUNITY): Payer: Medicaid Other

## 2011-12-31 ENCOUNTER — Encounter (HOSPITAL_COMMUNITY): Payer: Self-pay | Admitting: Emergency Medicine

## 2011-12-31 DIAGNOSIS — G4730 Sleep apnea, unspecified: Secondary | ICD-10-CM | POA: Insufficient documentation

## 2011-12-31 DIAGNOSIS — Z86718 Personal history of other venous thrombosis and embolism: Secondary | ICD-10-CM | POA: Insufficient documentation

## 2011-12-31 DIAGNOSIS — I10 Essential (primary) hypertension: Secondary | ICD-10-CM

## 2011-12-31 DIAGNOSIS — R0789 Other chest pain: Secondary | ICD-10-CM

## 2011-12-31 DIAGNOSIS — R071 Chest pain on breathing: Secondary | ICD-10-CM | POA: Insufficient documentation

## 2011-12-31 DIAGNOSIS — Z9089 Acquired absence of other organs: Secondary | ICD-10-CM | POA: Insufficient documentation

## 2011-12-31 DIAGNOSIS — F17200 Nicotine dependence, unspecified, uncomplicated: Secondary | ICD-10-CM | POA: Insufficient documentation

## 2011-12-31 DIAGNOSIS — I509 Heart failure, unspecified: Secondary | ICD-10-CM | POA: Insufficient documentation

## 2011-12-31 DIAGNOSIS — R079 Chest pain, unspecified: Secondary | ICD-10-CM

## 2011-12-31 DIAGNOSIS — Z79899 Other long term (current) drug therapy: Secondary | ICD-10-CM | POA: Insufficient documentation

## 2011-12-31 DIAGNOSIS — F319 Bipolar disorder, unspecified: Secondary | ICD-10-CM | POA: Insufficient documentation

## 2011-12-31 DIAGNOSIS — Z7901 Long term (current) use of anticoagulants: Secondary | ICD-10-CM | POA: Insufficient documentation

## 2011-12-31 LAB — PROTIME-INR: Protime: 13.6 seconds (ref 10.0–13.8)

## 2011-12-31 LAB — CBC
Hemoglobin: 13.5 g/dL (ref 12.0–15.0)
MCHC: 33.8 g/dL (ref 30.0–36.0)
RBC: 4.87 MIL/uL (ref 3.87–5.11)
WBC: 8.7 10*3/uL (ref 4.0–10.5)

## 2011-12-31 LAB — CARDIAC PANEL(CRET KIN+CKTOT+MB+TROPI)
CK, MB: 2.6 ng/mL (ref 0.3–4.0)
Relative Index: 1.6 (ref 0.0–2.5)
Troponin I: <0.3 ng/mL (ref <0.30)

## 2011-12-31 LAB — HEPATIC FUNCTION PANEL
ALT: 16 U/L (ref 7–35)
Alkaline Phosphatase: 59 U/L (ref 25–125)

## 2011-12-31 LAB — BASIC METABOLIC PANEL
Chloride: 106 mEq/L (ref 96–112)
Creatinine: 0.9 mg/dL (ref 0.5–1.1)
GFR calc non Af Amer: 72 mL/min — ABNORMAL LOW (ref >90)
Glucose, Bld: 88 mg/dL (ref 70–99)
Potassium: 3.7 mEq/L (ref 3.5–5.1)
Sodium: 138 mEq/L (ref 135–145)

## 2011-12-31 LAB — TSH: TSH: 1.44 u[IU]/mL (ref 0.41–5.90)

## 2011-12-31 LAB — CBC AND DIFFERENTIAL: HCT: 42 % (ref 36–46)

## 2011-12-31 LAB — POCT PREGNANCY, URINE: Preg Test, Ur: NEGATIVE

## 2011-12-31 LAB — RETICULOCYTES: RBC.: 4.87 MIL/uL (ref 3.87–5.11)

## 2011-12-31 LAB — LIPID PANEL: Cholesterol: 124 mg/dL (ref 0–200)

## 2011-12-31 NOTE — ED Notes (Signed)
Pt to xray

## 2011-12-31 NOTE — ED Notes (Signed)
Arrived by EMS. Sudden onset of left sided chest pain at 1800. Radiating to L arm. EMS 12 lead shows some depression and elevation. Pain 8/10. 3 nitro pain 3/10 now. Allergic to asa. 20 R AC. bp  220/ 130 now, 150/80. Pt has anxiety as well.

## 2011-12-31 NOTE — ED Notes (Signed)
Pt returned from xray

## 2011-12-31 NOTE — ED Provider Notes (Signed)
History     CSN: [id number]  Arrival date & time 12/31/11  4098   First MD Initiated Contact with Patient 12/31/11 1924      Chief Complaint  Patient presents with  . Chest Pain    (Consider location/radiation/quality/duration/timing/severity/associated sxs/prior treatment) HPI Comments: Amber Vaughn presents for evaluation of chest pain.  She states she had sudden onset of a chest tightness at 1800 that has been associated with palpitations of a "fluttering" heart beat.  She denies palpitations currently but states they have been sporadic but sometimes in rapid succession.  She denies any fevers, cough, syncope, abd pain, leg pain, or recent significant travel.  Patient is a 51 y.o. female presenting with chest pain. The history is provided by the patient. No language interpreter was used.  Chest Pain The chest pain began 1 - 2 hours ago. Chest pain occurs intermittently. The chest pain is unchanged. At its most intense, the pain is at 6/10. The pain is currently at 3/10. The severity of the pain is mild. The pain does not radiate. Primary symptoms include shortness of breath and palpitations. Pertinent negatives for primary symptoms include no fever, no fatigue, no syncope, no cough, no wheezing, no abdominal pain, no nausea, no vomiting, no dizziness and no altered mental status.  The palpitations began 1 to 2 hours ago. An episode of palpitations lasts for less than 5 minutes (transient episodes lasting less than a minute that occur in rapid succession). The palpitations have occurred more than 10 time(s). The palpitations also occurred with shortness of breath. The palpitations did not occur with dizziness.  Pertinent negatives for associated symptoms include no claudication, no diaphoresis, no lower extremity edema, no near-syncope, no numbness, no orthopnea, no paroxysmal nocturnal dyspnea and no weakness. She tried nothing for the symptoms. Risk factors include obesity and sedentary  lifestyle.  Her past medical history is significant for CHF, hypertension, PE and PVD.  Her family medical history is significant for CAD in family, heart disease in family, hyperlipidemia in family and hypertension in family.  Procedure history is positive for echocardiogram and exercise treadmill test.     Past Medical History  Diagnosis Date  . DVT (deep vein thrombosis) in pregnancy   . Hypertension   . CHF (congestive heart failure)   . Bipolar 1 disorder   . Depression   . Bronchiolitis   . Sleep apnea     Past Surgical History  Procedure Date  . Cesarean section   . Cholecystectomy   . Carotid stent     No family history on file.  History  Substance Use Topics  . Smoking status: Current Everyday Smoker -- 0.2 packs/day    Types: Cigarettes  . Smokeless tobacco: Not on file  . Alcohol Use: No    OB History    Grav Para Term Preterm Abortions TAB SAB Ect Mult Living   4 3 3  1  1   3       Review of Systems  Constitutional: Negative for fever, diaphoresis, activity change and fatigue.  Eyes: Negative.   Respiratory: Positive for chest tightness and shortness of breath. Negative for cough and wheezing.   Cardiovascular: Positive for chest pain and palpitations. Negative for orthopnea, claudication, syncope and near-syncope.  Gastrointestinal: Negative for nausea, vomiting and abdominal pain.  Genitourinary: Negative.   Musculoskeletal: Negative.   Skin: Negative.   Neurological: Negative for dizziness, weakness, light-headedness, numbness and headaches.  Hematological: Negative.   Psychiatric/Behavioral: Negative.  Negative for  altered mental status.    Allergies  Amoxicillin; Aspirin; Penicillins; Rivaroxaban; and Xanax  Home Medications   Current Outpatient Rx  Name Route Sig Dispense Refill  . CLONAZEPAM 0.5 MG PO TABS Oral Take 0.5 mg by mouth 2 (two) times daily.    Marland Kitchen CLONIDINE HCL 0.2 MG PO TABS Oral Take 0.2 mg by mouth 2 (two) times daily.      Marland Kitchen HYDROCODONE-ACETAMINOPHEN 10-650 MG PO TABS Oral Take 1 tablet by mouth every 6 (six) hours as needed. For pain     . LISINOPRIL 40 MG PO TABS Oral Take 40 mg by mouth 2 (two) times daily.     Marland Kitchen POTASSIUM CHLORIDE CRYS ER 20 MEQ PO TBCR Oral Take 20 mEq by mouth 2 (two) times daily.    Marland Kitchen PROMETHAZINE HCL 25 MG PO TABS Oral Take 25 mg by mouth every 6 (six) hours as needed. For nausea    . WARFARIN SODIUM 5 MG PO TABS Oral Take 7.5 mg by mouth daily.      BP 171/88  Pulse 81  Temp 98.3 F (36.8 C) (Oral)  Resp 22  SpO2 100%  LMP 12/17/2011  Physical Exam  Nursing note and vitals reviewed. Constitutional: She appears well-developed and well-nourished. No distress.  HENT:  Head: Normocephalic and atraumatic.  Right Ear: External ear normal.  Left Ear: External ear normal.  Nose: Nose normal.  Mouth/Throat: Oropharynx is clear and moist. No oropharyngeal exudate.  Eyes: Conjunctivae and EOM are normal. Pupils are equal, round, and reactive to light. Right eye exhibits no discharge. Left eye exhibits no discharge. No scleral icterus.  Neck: Normal range of motion. Neck supple. No JVD present. No tracheal deviation present. No thyromegaly present.  Cardiovascular: Normal rate, regular rhythm, S1 normal, S2 normal, normal heart sounds, intact distal pulses and normal pulses.  PMI is not displaced.  Exam reveals no gallop, no friction rub and no decreased pulses.   No murmur heard. Pulmonary/Chest: Effort normal and breath sounds normal. No stridor. No respiratory distress. She has no decreased breath sounds. She has no wheezes. She has no rhonchi. She has no rales. She exhibits tenderness.       + central ant chest wall tenderness.  No deformities or flail.  Abdominal: Soft. Normal appearance and bowel sounds are normal. She exhibits no distension and no mass. There is no hepatosplenomegaly. There is no tenderness. There is no rebound, no guarding and no CVA tenderness.       Obese,  protuberant  Musculoskeletal: Normal range of motion. She exhibits edema. She exhibits no tenderness.  Lymphadenopathy:    She has no cervical adenopathy.  Skin: She is not diaphoretic.    ED Course  Procedures (including critical care time)   Labs Reviewed  CBC  BASIC METABOLIC PANEL  CARDIAC PANEL(CRET KIN+CKTOT+MB+TROPI)  RETICULOCYTES  PROTIME-INR   No results found.   No diagnosis found.   Date: 12/31/2011  Rate: 78 bpm  Rhythm: sinus  QRS Axis: left  Intervals: normal  ST/T Wave abnormalities: nonspecific ST changes  Conduction Disutrbances:LVH + repol abnormalities  Narrative Interpretation:   Old EKG Reviewed: none available and unchanged      MDM  Pt presents for evaluation of chest pain and palpitations.  She reports hx of PE, carotid stenting, htn, and CHF.  Secondary to multiple cardiac risk factors plan initiate routine CP screening.  Will likely also obtain imaging to assess for evidence of PE.  Plan symptomatic care as awaiting results.  0055. Pt is pain free.  2nd trop is negative.  BP is improved.  Pt has some reproducible chest wall pain.  She has a subtherapeutic INR.  She has no clinical evidence of PE.  She has not recently been seen at a coumadin clinic but states that this pain is not the same as the discomfort she had related to previous blood clots.  Changes noted on EKG are not acute and were seen on a previous EKG.  Pt does not have a cardiologist currently but has established care with a local primary physician.  She states she had a negative stress test 8 mo ago in [place].  She would like to be discharged home tonight.  Encouraged outpt f/u with both her new PMD and with a local cardiologist.  She states she will be able to comply.        Tobin Chad, MD 01/01/12 [phone]

## 2012-01-01 LAB — TROPONIN I: Troponin I: <0.3 ng/mL (ref <0.30)

## 2012-01-06 ENCOUNTER — Emergency Department (HOSPITAL_COMMUNITY)
Admission: EM | Admit: 2012-01-06 | Discharge: 2012-01-08 | Disposition: A | Discharge status: Home / Self Care | Payer: Medicaid Other | Attending: Emergency Medicine | Admitting: Emergency Medicine

## 2012-01-06 ENCOUNTER — Encounter (HOSPITAL_COMMUNITY): Payer: Self-pay | Admitting: Family Medicine

## 2012-01-06 DIAGNOSIS — F329 Major depressive disorder, single episode, unspecified: Secondary | ICD-10-CM

## 2012-01-06 DIAGNOSIS — Z7901 Long term (current) use of anticoagulants: Secondary | ICD-10-CM | POA: Insufficient documentation

## 2012-01-06 DIAGNOSIS — F3289 Other specified depressive episodes: Secondary | ICD-10-CM | POA: Insufficient documentation

## 2012-01-06 DIAGNOSIS — Z8659 Personal history of other mental and behavioral disorders: Secondary | ICD-10-CM | POA: Insufficient documentation

## 2012-01-06 DIAGNOSIS — IMO0002 Reserved for concepts with insufficient information to code with codable children: Secondary | ICD-10-CM | POA: Insufficient documentation

## 2012-01-06 DIAGNOSIS — I10 Essential (primary) hypertension: Secondary | ICD-10-CM | POA: Insufficient documentation

## 2012-01-06 DIAGNOSIS — Z86718 Personal history of other venous thrombosis and embolism: Secondary | ICD-10-CM | POA: Insufficient documentation

## 2012-01-06 LAB — COMPREHENSIVE METABOLIC PANEL
ALT: 18 U/L (ref 0–35)
Alkaline Phosphatase: 61 U/L (ref 39–117)
CO2: 24 mEq/L (ref 19–32)
GFR calc Af Amer: 88 mL/min — ABNORMAL LOW (ref >90)
GFR calc non Af Amer: 76 mL/min — ABNORMAL LOW (ref >90)
Glucose, Bld: 86 mg/dL (ref 70–99)
Potassium: 3.7 mEq/L (ref 3.5–5.1)
Sodium: 136 mEq/L (ref 135–145)
Total Bilirubin: 0.4 mg/dL (ref 0.3–1.2)

## 2012-01-06 LAB — POCT PREGNANCY, URINE: Preg Test, Ur: NEGATIVE

## 2012-01-06 LAB — CBC
Hemoglobin: 12.8 g/dL (ref 12.0–15.0)
MCH: 27.2 pg (ref 26.0–34.0)
RBC: 4.7 MIL/uL (ref 3.87–5.11)

## 2012-01-06 MED ORDER — CLONIDINE HCL 0.1 MG PO TABS
0.2000 mg | ORAL_TABLET | Freq: Two times a day (BID) | ORAL | Status: DC
  Administered 2012-01-07 – 2012-01-08 (×4): 0.2 mg via ORAL
  Filled 2012-01-06 (×4): qty 2

## 2012-01-06 MED ORDER — NICOTINE 21 MG/24HR TD PT24
21.0000 mg | MEDICATED_PATCH | Freq: Every day | TRANSDERMAL | Status: DC
  Filled 2012-01-06 (×2): qty 1

## 2012-01-06 MED ORDER — LISINOPRIL 40 MG PO TABS
40.0000 mg | ORAL_TABLET | Freq: Two times a day (BID) | ORAL | Status: DC
  Administered 2012-01-07 – 2012-01-08 (×4): 40 mg via ORAL
  Filled 2012-01-06 (×5): qty 1

## 2012-01-06 MED ORDER — POTASSIUM CHLORIDE CRYS ER 20 MEQ PO TBCR
20.0000 meq | EXTENDED_RELEASE_TABLET | Freq: Two times a day (BID) | ORAL | Status: DC
  Administered 2012-01-07 (×2): 20 meq via ORAL
  Filled 2012-01-06 (×4): qty 1

## 2012-01-06 MED ORDER — ZOLPIDEM TARTRATE 5 MG PO TABS: 5.0000 mg | ORAL_TABLET | Freq: Every evening | ORAL | Status: DC | PRN

## 2012-01-06 MED ORDER — ACETAMINOPHEN 325 MG PO TABS: 650.0000 mg | ORAL_TABLET | ORAL | Status: DC | PRN

## 2012-01-06 MED ORDER — CLONAZEPAM 0.5 MG PO TABS
0.5000 mg | ORAL_TABLET | Freq: Two times a day (BID) | ORAL | Status: DC
  Administered 2012-01-07 – 2012-01-08 (×4): 0.5 mg via ORAL
  Filled 2012-01-06 (×4): qty 1

## 2012-01-06 MED ORDER — ONDANSETRON HCL 4 MG PO TABS: 4.0000 mg | ORAL_TABLET | Freq: Three times a day (TID) | ORAL | Status: DC | PRN

## 2012-01-06 MED ORDER — LORAZEPAM 1 MG PO TABS
1.0000 mg | ORAL_TABLET | Freq: Three times a day (TID) | ORAL | Status: DC | PRN
  Administered 2012-01-07 – 2012-01-08 (×2): 1 mg via ORAL
  Filled 2012-01-06 (×2): qty 1

## 2012-01-06 MED ORDER — WARFARIN SODIUM 7.5 MG PO TABS
7.5000 mg | ORAL_TABLET | Freq: Every day | ORAL | Status: DC
  Administered 2012-01-07: 7.5 mg via ORAL
  Filled 2012-01-06 (×2): qty 1

## 2012-01-06 NOTE — ED Notes (Addendum)
Pt belongings in 2bags in locker 2 triage.  Seen by security and wand.

## 2012-01-06 NOTE — ED Notes (Signed)
Patient presents voluntarily for medical clearance. Patient accompanied Mobile Crisis Rep who states that were contacted by DSS stating that patient was having SI/HI and was running outside chasing the neighborhood children.

## 2012-01-07 LAB — RAPID URINE DRUG SCREEN, HOSP PERFORMED
Barbiturates: NOT DETECTED
Tetrahydrocannabinol: NOT DETECTED

## 2012-01-07 NOTE — ED Provider Notes (Signed)
History     CSN: [id number]  Arrival date & time 01/06/12  2130   First MD Initiated Contact with Patient 01/06/12 2318      Chief Complaint  Patient presents with  . Medical Clearance    (Consider location/radiation/quality/duration/timing/severity/associated sxs/prior treatment) HPI Comments: Patient presents today with a chief complaint of SI and HI.  She reports that she began having suicidal thoughts earlier today.  She states that she also has a plan to cut her wrist.  She is also having homicidal thoughts towards a couple of people in particular.  She will not say who the people are.  She does not have a homicidal plan.  She does have a history of Bipolar and Schizophrenia.  She has been off of her medications for the past 3-4 months.  She states that she recently moved from [place] and has not established herself with a PCP.  She denies alcohol or recreational drug use.  The history is provided by the patient.    Past Medical History  Diagnosis Date  . DVT (deep vein thrombosis) in pregnancy   . Hypertension   . CHF (congestive heart failure)   . Bipolar 1 disorder   . Depression   . Bronchiolitis   . Sleep apnea     Past Surgical History  Procedure Date  . Cesarean section   . Cholecystectomy   . Carotid stent     No family history on file.  History  Substance Use Topics  . Smoking status: Current Everyday Smoker -- 0.2 packs/day    Types: Cigarettes  . Smokeless tobacco: Not on file  . Alcohol Use: No    OB History    Grav Para Term Preterm Abortions TAB SAB Ect Mult Living   4 3 3  1  1   3       Review of Systems  Constitutional: Negative for fever and chills.  Eyes: Negative for visual disturbance.  Neurological: Negative for headaches.  Psychiatric/Behavioral: Positive for suicidal ideas, disturbed wake/sleep cycle, dysphoric mood and agitation. Negative for hallucinations, confusion and self-injury.    Allergies  Amoxicillin; Aspirin;  Penicillins; Rivaroxaban; and Xanax  Home Medications   Current Outpatient Rx  Name Route Sig Dispense Refill  . CLONAZEPAM 0.5 MG PO TABS Oral Take 0.5 mg by mouth 2 (two) times daily.    Marland Kitchen CLONIDINE HCL 0.2 MG PO TABS Oral Take 0.2 mg by mouth 2 (two) times daily.    . IBUPROFEN 200 MG PO TABS Oral Take 800 mg by mouth every 6 (six) hours as needed.    Marland Kitchen LISINOPRIL 40 MG PO TABS Oral Take 40 mg by mouth 2 (two) times daily.     Marland Kitchen POTASSIUM CHLORIDE CRYS ER 20 MEQ PO TBCR Oral Take 20 mEq by mouth 2 (two) times daily.    . WARFARIN SODIUM 5 MG PO TABS Oral Take 7.5 mg by mouth daily. Taking 1 1/2 once daily      BP 194/104  Pulse 81  Temp 98.4 F (36.9 C) (Oral)  Resp 22  SpO2 100%  LMP 01/06/2012  Physical Exam  Nursing note and vitals reviewed. Constitutional: She is oriented to person, place, and time. She appears well-developed and well-nourished. No distress.  HENT:  Head: Normocephalic and atraumatic.  Eyes: EOM are normal. Pupils are equal, round, and reactive to light.  Neck: Normal range of motion. Neck supple.  Cardiovascular: Normal rate, regular rhythm and normal heart sounds.   Pulmonary/Chest: Effort normal  and breath sounds normal.  Neurological: She is alert and oriented to person, place, and time. No cranial nerve deficit.  Skin: Skin is warm and dry. She is not diaphoretic.  Psychiatric: Her speech is rapid and/or pressured. She is agitated. She is not aggressive, not actively hallucinating and not combative. Thought content is not paranoid and not delusional. She exhibits a depressed mood. She expresses homicidal and suicidal ideation. She expresses suicidal plans. She expresses no homicidal plans.    ED Course  Procedures (including critical care time)  Labs Reviewed  COMPREHENSIVE METABOLIC PANEL - Abnormal; Notable for the following:    GFR calc non Af Amer 76 (*)     GFR calc Af Amer 88 (*)     All other components within normal limits  CBC    ETHANOL  ACETAMINOPHEN LEVEL  POCT PREGNANCY, URINE  URINE RAPID DRUG SCREEN (HOSP PERFORMED)   No results found.   No diagnosis found.    MDM  Patient with a history of Schizophrenia and Bipolar presents voluntarily today for SI and HI.  She has been having thoughts of slitting her wrist and also homicidal thoughts towards particular individuals.  She has been off of her Psychiatric medications for 3-4 months.  Patient discussed with ACT team who will evaluate patient.  Psych holding orders have been placed.  Patient cooperative at this time.        Pascal Lux [place], PA-C 01/07/12 [phone]

## 2012-01-07 NOTE — ED Notes (Signed)
[facility] contacted both of pt.'s pharmacies, no record of pt. Taking Lasix.

## 2012-01-07 NOTE — ED Notes (Signed)
Pt. Refused potassium, , states that she doesn't always take at home.

## 2012-01-07 NOTE — ED Provider Notes (Signed)
Medical screening examination/treatment/procedure(s) were performed by non-physician practitioner and as supervising physician I was immediately available for consultation/collaboration.    Cindi Ghazarian D Shunte Senseney, MD 01/07/12 1620 

## 2012-01-07 NOTE — ED Notes (Signed)
Left message with mobile crisis to call back and communicate to MD concerning pt's d/c info.

## 2012-01-07 NOTE — ED Notes (Signed)
Pt. Tearful and feeling scared, medication given

## 2012-01-07 NOTE — ED Notes (Signed)
Pt reports she is now feeling like she is ready to be d/c, states she is no longer feeling suicidal and was never feeling homicidal. Pt states she never hears voices. Pt reports being off her meds x 4 mo because she could not afford them and now feels like she has had a dose of her meds she can be managed on an outpt basis. Pt states she was feeling stressed yesterday but has now got some rest and is feeling better. Voluntary pt and keeps requesting to be d/c

## 2012-01-07 NOTE — ED Provider Notes (Signed)
Patient reports stress due to financial problems, and children. She previously got psychiatric care when she was living in another city. She has no psychiatric care in [place]. She is calm, cooperative she will be evaluated by psychiatry, today.  Flint Melter, MD 01/07/12 [phone]

## 2012-01-07 NOTE — Progress Notes (Signed)
Rx:  PT/INR daily x 3 days per [employer]  No daily INR ordered for this pt- per [employer] policy I entered a daily order x 3 days.  Please order additional INRs or order Warfarin per RX.  Lorenza Evangelist 01/07/2012 6:41 AM

## 2012-01-08 DIAGNOSIS — F4320 Adjustment disorder, unspecified: Secondary | ICD-10-CM

## 2012-01-08 DIAGNOSIS — F329 Major depressive disorder, single episode, unspecified: Secondary | ICD-10-CM

## 2012-01-08 LAB — PROTIME-INR
INR: 1.02 (ref 0.00–1.49)
Prothrombin Time: 13.6 seconds (ref 11.6–15.2)

## 2012-01-08 NOTE — BHH Counselor (Signed)
Completed documentation for rescind IVC per psychiatrist recommendation.

## 2012-01-08 NOTE — ED Provider Notes (Addendum)
  Physical Exam  BP 138/83  Pulse 64  Temp 97.9 F (36.6 C) (Oral)  Resp 18  SpO2 99%  LMP 01/06/2012  Physical Exam  ED Course  Procedures  MDM Patient is being evaluated by mobile crisis. She'll likely get a psychiatry evaluation by Dr. Shela Commons today.      Juliet Rude. Rubin Payor, MD 01/08/12 [phone]  Patient's been seen by psych patient has been seen by psychiatry. He is recommended discharge home. She will followup with [facility], for her medications. theraputic alternatives has been arranging follow up.  Juliet Rude. Rubin Payor, MD 01/08/12 1426

## 2012-01-08 NOTE — Consult Note (Signed)
Reason for Consult: Depression, and suicidal ideation Referring Physician: Dr. Rubin Payor  Amber Vaughn is an 51 y.o. female.  HPI: Patient was seen and chart reviewed. Case was discussed with the mobile crisis therapist, Mrs. Yevonne Aline and interviewed. Patient. Patient stated initially she was talking with the Department of Social Services regarding her as current, severe living situation. Financial situation and unable to get the services. Social service contacted the mobile crisis team for possible the depression with suicidal ideation, and possible, the treatment as an inpatient. Patient has been suffering with the chronic depression, anxiety, and long history of for self-injurious behavior and homicidal ideations, while living in [place], [place]. Patient has difficulty getting outpatient psychiatric services due to transportation and recent relocation and new town. Patient has been feeling sad, depressed, isolated, withdrawn. Patient has a passive suicidal ideation, but no intentions, or plan. Patient has no homicidal ideation, intentions, or plan. Patient has no evidence of psychotic symptoms. Patient is requesting to be discharged to the outpatient psychiatric services and also requesting the in-home services for counseling. Patient has the multiple medical problems including the Deep vein thrombosis, hypertension, congestive heart failure, bronchiolitis, and sleep apnea. Patient has the 11 years old son and a 41 years old son with her. She has a 68 some years old son lives by himself. Patient has received the multiple medications including Zoloft, Klonopin, some trazodone in the past. Patient urine drug screen was negative for drug of abuse.  Past Medical History  Diagnosis Date  . DVT (deep vein thrombosis) in pregnancy   . Hypertension   . CHF (congestive heart failure)   . Bipolar 1 disorder   . Depression   . Bronchiolitis   . Sleep apnea     Past Surgical History    Procedure Date  . Cesarean section   . Cholecystectomy   . Carotid stent     No family history on file.  Social History:  reports that she has been smoking Cigarettes.  She has been smoking about .25 packs per day. She does not have any smokeless tobacco history on file. She reports that she does not drink alcohol or use illicit drugs.  Allergies:  Allergies  Allergen Reactions  . Amoxicillin Hives and Swelling  . Aspirin Nausea And Vomiting  . Penicillins Hives and Swelling  . Rivaroxaban Other (See Comments)    "causes her to bleed more"  . Xanax (Alprazolam) Nausea And Vomiting    Medications: I have reviewed the patient's current medications.  Results for orders placed during the hospital encounter of 01/06/12 (from the past 48 hour(s))  CBC     Status: Normal   Collection Time   01/06/12 10:30 PM      Component Value Range Comment   WBC 7.9  4.0 - 10.5 K/uL    RBC 4.70  3.87 - 5.11 MIL/uL    Hemoglobin 12.8  12.0 - 15.0 g/dL    HCT 16.1  09.6 - 04.5 %    MCV 82.6  78.0 - 100.0 fL    MCH 27.2  26.0 - 34.0 pg    MCHC 33.0  30.0 - 36.0 g/dL    RDW 40.9  81.1 - 91.4 %    Platelets 221  150 - 400 K/uL   COMPREHENSIVE METABOLIC PANEL     Status: Abnormal   Collection Time   01/06/12 10:30 PM      Component Value Range Comment   Sodium 136  135 - 145 mEq/L  Potassium 3.7  3.5 - 5.1 mEq/L    Chloride 105  96 - 112 mEq/L    CO2 24  19 - 32 mEq/L    Glucose, Bld 86  70 - 99 mg/dL    BUN 16  6 - 23 mg/dL    Creatinine, Ser 1.91  0.50 - 1.10 mg/dL    Calcium 9.7  8.4 - 47.8 mg/dL    Total Protein 7.4  6.0 - 8.3 g/dL    Albumin 3.9  3.5 - 5.2 g/dL    AST 26  0 - 37 U/L    ALT 18  0 - 35 U/L    Alkaline Phosphatase 61  39 - 117 U/L    Total Bilirubin 0.4  0.3 - 1.2 mg/dL    GFR calc non Af Amer 76 (*) >90 mL/min    GFR calc Af Amer 88 (*) >90 mL/min   ETHANOL     Status: Normal   Collection Time   01/06/12 10:30 PM      Component Value Range Comment   Alcohol,  Ethyl (B) <11  0 - 11 mg/dL   ACETAMINOPHEN LEVEL     Status: Normal   Collection Time   01/06/12 10:30 PM      Component Value Range Comment   Acetaminophen (Tylenol), Serum <15.0  10 - 30 ug/mL   URINE RAPID DRUG SCREEN (HOSP PERFORMED)     Status: Normal   Collection Time   01/06/12 11:36 PM      Component Value Range Comment   Opiates NONE DETECTED  NONE DETECTED    Cocaine NONE DETECTED  NONE DETECTED    Benzodiazepines NONE DETECTED  NONE DETECTED    Amphetamines NONE DETECTED  NONE DETECTED    Tetrahydrocannabinol NONE DETECTED  NONE DETECTED    Barbiturates NONE DETECTED  NONE DETECTED   POCT PREGNANCY, URINE     Status: Normal   Collection Time   01/06/12 11:37 PM      Component Value Range Comment   Preg Test, Ur NEGATIVE  NEGATIVE   PROTIME-INR     Status: Normal   Collection Time   01/07/12  1:35 AM      Component Value Range Comment   Prothrombin Time 13.8  11.6 - 15.2 seconds    INR 1.04  0.00 - 1.49   PROTIME-INR     Status: Normal   Collection Time   01/08/12  5:00 AM      Component Value Range Comment   Prothrombin Time 13.6  11.6 - 15.2 seconds    INR 1.02  0.00 - 1.49     No results found.  No psychosis and Positive for anxiety, bad mood, bipolar, depression, sleep disturbance and tobacco use Blood pressure 163/110, pulse 64, temperature 98.1 F (36.7 C), temperature source Oral, resp. rate 19, last menstrual period 01/06/2012, SpO2 100.00%.   Assessment/Plan: Major depressive disorder, chronic Adjustment disorder NOS  Recommended outpatient psychiatric services at [facility] and also the Act team services as Patient requested. Patient does not meet criteria for acute psychiatric hospitalization. Patient has been contracting for safety. No medications were initiated during this visit  Amber Vaughn,JANARDHAHA R. 01/08/2012, 11:54 AM

## 2012-01-12 ENCOUNTER — Telehealth (HOSPITAL_COMMUNITY): Payer: Self-pay | Admitting: Licensed Clinical Social Worker

## 2012-01-12 NOTE — BHH Counselor (Signed)
Amber Vaughn  [phone], patient's case manager with [employer] in [facility] calling for Amber Vaughn who was here at [facility] on the 28th of August. Patient was discharged August 30th. Amber Vaughn would like to know patient's discharge plan. Sts that she would would like to know what the plan was so that she could check with patient to see if she followed up as needed. Writer told Amber Vaughn that she would follow up on this issue, check the system, and get back with her.   Writer read over patient's chart from her admission to [facility] on August 28. After careful review writer discovered 1. Patient is in fact not in the ER at this present time and was here on the 28th. 2. Patient was brought in by mobile crises. Writer remembered this patient and also recalled patient stating that she wanted to be discharged home. On this day mobile crises was called and notified that their patient was requesting to be discharged home. Their staff told this Clinical research associate, "She can go..Just discharge her..that's fine and she already knows her follow up plan". Writer then stated on that day to their staff, "You will need to contact the EDP which was Dr. Jeraldine Loots and speak with him via phone or face/face". Writer transferred call to the EDP and somehow it was routed to the charge nurse. Later that day, the patient's nurse Amber Vaughn) stated that she received a call from mobile crises saying, "Patient could be discharged". Amber Vaughn also explained that patient would have to be discharged by the EDP.   As time passed their was another mobile crises patient in the ED in need of assistance. Mobile Crises was called to come out and see that patient. Writer told that since they were coming to see another patient they would also see Amber Vaughn to discharge her appropriately with a appropriated discharge plan. This writer's shift ended shortly after and their was no more contact regarding this patient until today when Amber Vaughn from [employer] called.   In order to  remedy this situation Clinical research associate contacted mobile crises. Spoke to Amber Vaughn and he was informed that they would need to contact patients case manager to provide her with patient's follow up plan as we here are unable to provide that information over the phone due to HIPPA laws.

## 2012-01-19 ENCOUNTER — Ambulatory Visit: Payer: Medicaid - Out of State | Admitting: Cardiology

## 2012-01-22 ENCOUNTER — Emergency Department (HOSPITAL_COMMUNITY)
Admission: EM | Admit: 2012-01-22 | Discharge: 2012-01-23 | Discharge status: Against Medical Advice | Payer: Medicaid Other | Attending: Emergency Medicine | Admitting: Emergency Medicine

## 2012-01-22 ENCOUNTER — Encounter (HOSPITAL_COMMUNITY): Payer: Self-pay | Admitting: Emergency Medicine

## 2012-01-22 DIAGNOSIS — M25559 Pain in unspecified hip: Secondary | ICD-10-CM | POA: Insufficient documentation

## 2012-01-22 DIAGNOSIS — I10 Essential (primary) hypertension: Secondary | ICD-10-CM | POA: Insufficient documentation

## 2012-01-22 DIAGNOSIS — I509 Heart failure, unspecified: Secondary | ICD-10-CM | POA: Insufficient documentation

## 2012-01-22 DIAGNOSIS — G4730 Sleep apnea, unspecified: Secondary | ICD-10-CM | POA: Insufficient documentation

## 2012-01-22 DIAGNOSIS — F17200 Nicotine dependence, unspecified, uncomplicated: Secondary | ICD-10-CM | POA: Insufficient documentation

## 2012-01-22 DIAGNOSIS — M545 Low back pain, unspecified: Secondary | ICD-10-CM | POA: Insufficient documentation

## 2012-01-22 DIAGNOSIS — M549 Dorsalgia, unspecified: Secondary | ICD-10-CM

## 2012-01-22 DIAGNOSIS — F319 Bipolar disorder, unspecified: Secondary | ICD-10-CM | POA: Insufficient documentation

## 2012-01-22 DIAGNOSIS — Z7901 Long term (current) use of anticoagulants: Secondary | ICD-10-CM | POA: Insufficient documentation

## 2012-01-22 DIAGNOSIS — Z86711 Personal history of pulmonary embolism: Secondary | ICD-10-CM | POA: Insufficient documentation

## 2012-01-22 DIAGNOSIS — Z880 Allergy status to penicillin: Secondary | ICD-10-CM | POA: Insufficient documentation

## 2012-01-22 HISTORY — DX: Other pulmonary embolism without acute cor pulmonale: I26.99

## 2012-01-22 NOTE — ED Notes (Addendum)
Pt reports R hip pain radiating down leg X 1.5 months; CMS intact; pt ambulatory; pt noted to have increased BP--reports has not taken night BP meds yet

## 2012-01-22 NOTE — ED Notes (Signed)
Pts name called no answer, Pt never came back to triage

## 2012-01-23 ENCOUNTER — Emergency Department (HOSPITAL_COMMUNITY)
Admission: EM | Admit: 2012-01-23 | Discharge: 2012-01-23 | Disposition: A | Discharge status: Home / Self Care | Payer: Medicaid Other | Attending: Emergency Medicine | Admitting: Emergency Medicine

## 2012-01-23 ENCOUNTER — Emergency Department (HOSPITAL_COMMUNITY): Payer: Medicaid Other

## 2012-01-23 ENCOUNTER — Encounter (HOSPITAL_COMMUNITY): Payer: Self-pay | Admitting: Family Medicine

## 2012-01-23 DIAGNOSIS — G4730 Sleep apnea, unspecified: Secondary | ICD-10-CM | POA: Insufficient documentation

## 2012-01-23 DIAGNOSIS — F17200 Nicotine dependence, unspecified, uncomplicated: Secondary | ICD-10-CM | POA: Insufficient documentation

## 2012-01-23 DIAGNOSIS — F319 Bipolar disorder, unspecified: Secondary | ICD-10-CM | POA: Insufficient documentation

## 2012-01-23 DIAGNOSIS — M5430 Sciatica, unspecified side: Secondary | ICD-10-CM | POA: Insufficient documentation

## 2012-01-23 DIAGNOSIS — Z86711 Personal history of pulmonary embolism: Secondary | ICD-10-CM | POA: Insufficient documentation

## 2012-01-23 DIAGNOSIS — I509 Heart failure, unspecified: Secondary | ICD-10-CM | POA: Insufficient documentation

## 2012-01-23 DIAGNOSIS — I10 Essential (primary) hypertension: Secondary | ICD-10-CM | POA: Insufficient documentation

## 2012-01-23 DIAGNOSIS — Z7901 Long term (current) use of anticoagulants: Secondary | ICD-10-CM | POA: Insufficient documentation

## 2012-01-23 DIAGNOSIS — Z880 Allergy status to penicillin: Secondary | ICD-10-CM | POA: Insufficient documentation

## 2012-01-23 MED ORDER — IBUPROFEN 600 MG PO TABS: 600.0000 mg | ORAL_TABLET | Freq: Four times a day (QID) | ORAL | Status: AC | PRN

## 2012-01-23 MED ORDER — ONDANSETRON HCL 4 MG PO TABS: 4.0000 mg | ORAL_TABLET | Freq: Four times a day (QID) | ORAL | Status: AC

## 2012-01-23 MED ORDER — ONDANSETRON 4 MG PO TBDP
4.0000 mg | ORAL_TABLET | Freq: Once | ORAL | Status: AC
  Administered 2012-01-23: 4 mg via ORAL
  Filled 2012-01-23: qty 1

## 2012-01-23 MED ORDER — ONDANSETRON 4 MG PO TBDP: 8.0000 mg | ORAL_TABLET | Freq: Once | ORAL | Status: DC

## 2012-01-23 MED ORDER — HYDROMORPHONE HCL PF 2 MG/ML IJ SOLN: 2.0000 mg | Freq: Once | INTRAMUSCULAR | Status: DC

## 2012-01-23 MED ORDER — OXYCODONE-ACETAMINOPHEN 5-325 MG PO TABS: 1.0000 | ORAL_TABLET | Freq: Four times a day (QID) | ORAL | Status: AC | PRN

## 2012-01-23 MED ORDER — OXYCODONE-ACETAMINOPHEN 5-325 MG PO TABS
1.0000 | ORAL_TABLET | Freq: Once | ORAL | Status: AC
  Administered 2012-01-23: 1 via ORAL
  Filled 2012-01-23: qty 1

## 2012-01-23 NOTE — ED Provider Notes (Signed)
Medical screening examination/treatment/procedure(s) were performed by non-physician practitioner and as supervising physician I was immediately available for consultation/collaboration.   Lyanne Co, MD 01/23/12 2236

## 2012-01-23 NOTE — ED Provider Notes (Signed)
History     CSN: [id number]  Arrival date & time 01/22/12  2148   First MD Initiated Contact with Patient 01/23/12 0022      Chief Complaint  Patient presents with  . Hip Pain   HPI  History provided by the patient. Patient is a 51 year old female with history of hypertension, CHF, PE and depression who presents with complaints of continued and severe right hip and leg pains. Patient states she has had waxing waning pains so low back and right hip and lower extremity for the past 4-5 months. Symptoms seem to be worsening over the past one to 2 months. Patient states that she came to the emergency room by ambulance with her son who is here for a knee injury and she felt she was having such severe pains and she wanted to be evaluated. She denies any new injury or trauma. She denies any associated numbness or weakness in the foot. Pain is worse with walking and movements. Patient continues to be able to orient states she used to walk a lot during the day. She has been taking over-the-counter pain medications without significant relief of symptoms. She denies any urinary or fecal incontinence, urinary retention or perineal numbness.   Past Medical History  Diagnosis Date  . Hypertension   . CHF (congestive heart failure)   . Bipolar 1 disorder   . Depression   . Bronchiolitis   . Sleep apnea   . PE (pulmonary embolism)     Past Surgical History  Procedure Date  . Cesarean section   . Cholecystectomy   . Carotid stent     History reviewed. No pertinent family history.  History  Substance Use Topics  . Smoking status: Current Every Day Smoker -- 0.2 packs/day    Types: Cigarettes  . Smokeless tobacco: Not on file  . Alcohol Use: No    OB History    Grav Para Term Preterm Abortions TAB SAB Ect Mult Living   4 3 3  1  1   3       Review of Systems  Constitutional: Negative for fever and chills.  Genitourinary: Negative for dysuria, frequency, hematuria and flank pain.    Musculoskeletal: Positive for back pain.  Neurological: Negative for weakness and numbness.    Allergies  Amoxicillin; Aspirin; Penicillins; Rivaroxaban; and Xanax  Home Medications   Current Outpatient Rx  Name Route Sig Dispense Refill  . CIPROFLOXACIN HCL 250 MG PO TABS Oral Take 250 mg by mouth 2 (two) times daily. Started 9/12    . CLONAZEPAM 0.5 MG PO TABS Oral Take 0.5 mg by mouth 2 (two) times daily.    Marland Kitchen CLONIDINE HCL 0.2 MG PO TABS Oral Take 0.2 mg by mouth 2 (two) times daily.    . IBUPROFEN 200 MG PO TABS Oral Take 800-1,200 mg by mouth every 6 (six) hours as needed. For pain    . LISINOPRIL 40 MG PO TABS Oral Take 40 mg by mouth 2 (two) times daily.     Marland Kitchen POTASSIUM CHLORIDE CRYS ER 20 MEQ PO TBCR Oral Take 20 mEq by mouth 2 (two) times daily as needed. For shaking or dizziness or dry mouth or cramps    . WARFARIN SODIUM 5 MG PO TABS Oral Take 7.5 mg by mouth daily.       BP 185/100  Pulse 65  Temp 98.4 F (36.9 C) (Oral)  Resp 18  SpO2 100%  LMP 01/06/2012  Physical Exam  Nursing note  and vitals reviewed. Constitutional: She is oriented to person, place, and time. She appears well-developed and well-nourished. No distress.       Obese  HENT:  Head: Normocephalic.  Neck: Normal range of motion. Neck supple.  Cardiovascular: Normal rate and regular rhythm.   Pulmonary/Chest: Effort normal and breath sounds normal. No respiratory distress. She has no wheezes. She has no rales.  Abdominal: Soft. There is no tenderness.       No CVA tenderness  Musculoskeletal:       Cervical back: Normal.       Thoracic back: Normal.       Lumbar back: She exhibits tenderness. She exhibits normal range of motion.       Back:  Neurological: She is alert and oriented to person, place, and time. Gait normal.  Skin: Skin is warm and dry. No rash noted.  Psychiatric: She has a normal mood and affect. Her behavior is normal.    ED Course  Procedures       1. Back pain        MDM  Patient seen and evaluated. Patient does not appear in any severe distress. Tendons and the presence several months. No new injury or trauma.  Patient left AMA prior to full valuation.      Angus Seller, [place] 01/23/12 [phone]

## 2012-01-23 NOTE — ED Notes (Signed)
Pt st's she has to leave because her daughter st's she is not going to wait on her.

## 2012-01-23 NOTE — ED Notes (Signed)
Patient ambulatory out of the department at this time

## 2012-01-23 NOTE — ED Provider Notes (Signed)
History  Scribed for Amber Chick, MD, the patient was seen in room TR05C/TR05C. This chart was scribed by Amber Vaughn. The patient's care started at 11:18 AM   CSN: [id number]  Arrival date & time 01/23/12  4098   First MD Initiated Contact with Patient 01/23/12 1115      Chief Complaint  Patient presents with  . Leg Pain     The history is provided by the patient. No language interpreter was used.   Amber Vaughn is a 51 y.o. female who presents to the Emergency Department complaining of right leg pain that started in her right hip and radiates down to her ankle.  Pt states that the pain started months ago and has gotten worse recently.  Pt denies any recent falls or injury.  Pt is currently on coumadin for previous blood clots.  She has taken pain medications for the leg pain with no relief.  Pt reports that some of the pain medications she has taken cause her to vomit.    Past Medical History  Diagnosis Date  . Hypertension   . CHF (congestive heart failure)   . Bipolar 1 disorder   . Depression   . Bronchiolitis   . Sleep apnea   . PE (pulmonary embolism)     Past Surgical History  Procedure Date  . Cesarean section   . Cholecystectomy   . Carotid stent     History reviewed. No pertinent family history.  History  Substance Use Topics  . Smoking status: Current Every Day Smoker -- 0.2 packs/day    Types: Cigarettes  . Smokeless tobacco: Not on file  . Alcohol Use: No    OB History    Grav Para Term Preterm Abortions TAB SAB Ect Mult Living   4 3 3  1  1   3       Review of Systems  Musculoskeletal: Positive for back pain and arthralgias (right hip and leg pain).  All other systems reviewed and are negative.    Allergies  Amoxicillin; Aspirin; Penicillins; Rivaroxaban; and Xanax  Home Medications   Current Outpatient Rx  Name Route Sig Dispense Refill  . CIPROFLOXACIN HCL 250 MG PO TABS Oral Take 250 mg by mouth 2 (two) times daily.  Started 9/12    . CLONAZEPAM 0.5 MG PO TABS Oral Take 0.5 mg by mouth 2 (two) times daily.    Marland Kitchen CLONIDINE HCL 0.2 MG PO TABS Oral Take 0.2 mg by mouth 2 (two) times daily.    . IBUPROFEN 200 MG PO TABS Oral Take 800-1,200 mg by mouth every 6 (six) hours as needed. For pain    . LISINOPRIL 40 MG PO TABS Oral Take 40 mg by mouth 2 (two) times daily.     Marland Kitchen POTASSIUM CHLORIDE CRYS ER 20 MEQ PO TBCR Oral Take 20 mEq by mouth 2 (two) times daily as needed. For shaking or dizziness or dry mouth or cramps    . WARFARIN SODIUM 5 MG PO TABS Oral Take 7.5 mg by mouth daily.     . IBUPROFEN 600 MG PO TABS Oral Take 1 tablet (600 mg total) by mouth every 6 (six) hours as needed for pain. 30 tablet 0  . ONDANSETRON HCL 4 MG PO TABS Oral Take 1 tablet (4 mg total) by mouth every 6 (six) hours. 12 tablet 0  . OXYCODONE-ACETAMINOPHEN 5-325 MG PO TABS Oral Take 1-2 tablets by mouth every 6 (six) hours as needed for pain. 15  tablet 0    BP 179/100  Pulse 61  Temp 98.6 F (37 C) (Oral)  Resp 16  SpO2 100%  LMP 01/06/2012  Physical Exam  Nursing note and vitals reviewed. Constitutional: She is oriented to person, place, and time. She appears well-developed and well-nourished. No distress.  HENT:  Head: Normocephalic and atraumatic.  Eyes: EOM are normal. Pupils are equal, round, and reactive to light.  Neck: Neck supple. No tracheal deviation present.  Pulmonary/Chest: Effort normal. No respiratory distress.  Musculoskeletal:       Tenderness in her right paraspinal muscles of the lumbar region.  Pain with ROM of her right hip.    Neurological: She is alert and oriented to person, place, and time.  Skin: Skin is warm and dry.  Psychiatric: She has a normal mood and affect. Her behavior is normal.    ED Course  Procedures   DIAGNOSTIC STUDIES: Oxygen Saturation is 100% on room air, normal by my interpretation.    COORDINATION OF CARE:  11:24 Ordered: DG Hip Complete Right;  Protime-INR    Labs Reviewed  PROTIME-INR  LAB REPORT - SCANNED   Dg Hip Complete Right  01/23/2012  *RADIOLOGY REPORT*  Clinical Data: Chronic right hip pain for 4 months with occasional numbness and tingling.  RIGHT HIP - COMPLETE 2+ VIEW  Comparison: 06/03/2011  Findings: Articular space and femoral head morphology appear normal and bilaterally symmetric.  No fracture, dislocation, or acute bony finding observed.  Symmetric teardrop distance noted.  IMPRESSION:  1.  No specific radiographic abnormality the right hip is observed.   Original Report Authenticated By: Amber Vaughn, M.D.      1. Sciatica       MDM  Pt presenting with low back pain with radiation to her leg.  No weakness of leg, no urinary retention, no incontinence of bowel or bladder.  No fever.  She has been taking pain medication as well as muscle relaxer.  Pain most c/w radiculopathy.  Will add steroids.  Pt encouraged to f/u with neurosurgery.  Discharged with strict return precautions.  Pt agreeable with plan.  I personally performed the services described in this documentation, which was scribed in my presence. The recorded information has been reviewed and considered.        Amber Chick, MD 01/24/12 1534

## 2012-01-23 NOTE — ED Notes (Signed)
Patient states that she is not wanting to stay at this time

## 2012-01-23 NOTE — ED Notes (Signed)
Patient transported to X-ray 

## 2012-01-23 NOTE — ED Notes (Signed)
Per pt she is having pain from her right hip down to her right ankle. sts was here last night but had to leave

## 2012-01-28 ENCOUNTER — Encounter: Payer: Self-pay | Admitting: Cardiovascular Disease

## 2012-01-28 ENCOUNTER — Ambulatory Visit (INDEPENDENT_AMBULATORY_CARE_PROVIDER_SITE_OTHER): Payer: Medicaid Other | Admitting: Cardiovascular Disease

## 2012-01-28 VITALS — BP 187/107 | HR 69 | Ht 65.0 in | Wt 265.0 lb

## 2012-01-28 DIAGNOSIS — Z812 Family history of tobacco abuse and dependence: Secondary | ICD-10-CM | POA: Insufficient documentation

## 2012-01-28 DIAGNOSIS — Z8489 Family history of other specified conditions: Secondary | ICD-10-CM

## 2012-01-28 DIAGNOSIS — J219 Acute bronchiolitis, unspecified: Secondary | ICD-10-CM | POA: Insufficient documentation

## 2012-01-28 DIAGNOSIS — I2699 Other pulmonary embolism without acute cor pulmonale: Secondary | ICD-10-CM | POA: Insufficient documentation

## 2012-01-28 DIAGNOSIS — F319 Bipolar disorder, unspecified: Secondary | ICD-10-CM | POA: Insufficient documentation

## 2012-01-28 DIAGNOSIS — I509 Heart failure, unspecified: Secondary | ICD-10-CM | POA: Insufficient documentation

## 2012-01-28 DIAGNOSIS — G4730 Sleep apnea, unspecified: Secondary | ICD-10-CM | POA: Insufficient documentation

## 2012-01-28 DIAGNOSIS — Z720 Tobacco use: Secondary | ICD-10-CM

## 2012-01-28 DIAGNOSIS — F329 Major depressive disorder, single episode, unspecified: Secondary | ICD-10-CM | POA: Insufficient documentation

## 2012-01-28 DIAGNOSIS — R079 Chest pain, unspecified: Secondary | ICD-10-CM

## 2012-01-28 DIAGNOSIS — F17200 Nicotine dependence, unspecified, uncomplicated: Secondary | ICD-10-CM

## 2012-01-28 DIAGNOSIS — I10 Essential (primary) hypertension: Secondary | ICD-10-CM | POA: Insufficient documentation

## 2012-01-28 MED ORDER — BUPROPION HCL ER (SR) 150 MG PO TB12: 150.0000 mg | ORAL_TABLET | Freq: Two times a day (BID) | ORAL | Status: DC

## 2012-01-28 NOTE — Progress Notes (Signed)
Patient ID: Amber Vaughn, female   DOB: 1960-10-11, 51 y.o.   MRN: [id number] 51 yo obese female referred from ER for chest pain.  She has behavioral health issues with depression and anxiety.  Moving from [place] to [place] but soon going to [place] to be near family.  Trying to take care of two boys and has older daughter that aggravates her.  Long standing atypical chest pain.  Normal myovue in [place] a year ago.  HTN on Rx.  Smokes about 5 cigs/day.  Has quit about 1.5 years ago. Patches and gum make her "aggravated"  Pain is non exertional sharp and in chest and shoulders.  Chronic with no recent change.  Chronic exertional dyspnea from obesity.  No palpitations, or syncope.    ROS: Denies fever, malais, weight loss, blurry vision, decreased visual acuity, cough, sputum, SOB, hemoptysis, pleuritic pain, palpitaitons, heartburn, abdominal pain, melena, lower extremity edema, claudication, or rash.  All other systems reviewed and negative   General: Affect appropriate Obese black female HEENT: normal Neck supple with no adenopathy JVP normal no bruits no thyromegaly Lungs clear with no wheezing and good diaphragmatic motion Heart:  S1/S2 no murmur,rub, gallop or click PMI normal Abdomen: benighn, BS positve, no tenderness, no AAA no bruit.  No HSM or HJR Distal pulses intact with no bruits No edema Neuro non-focal Skin warm and dry No muscular weakness  Medications Current Outpatient Prescriptions  Medication Sig Dispense Refill  . ciprofloxacin (CIPRO) 250 MG tablet Take 250 mg by mouth 2 (two) times daily. Started 9/12      . clonazePAM (KLONOPIN) 0.5 MG tablet Take 0.5 mg by mouth 2 (two) times daily.      . cloNIDine (CATAPRES) 0.2 MG tablet Take 0.2 mg by mouth 2 (two) times daily.      Marland Kitchen ibuprofen (ADVIL,MOTRIN) 200 MG tablet Take 800-1,200 mg by mouth every 6 (six) hours as needed. For pain      . ibuprofen (ADVIL,MOTRIN) 600 MG tablet Take 1 tablet (600 mg  total) by mouth every 6 (six) hours as needed for pain.  30 tablet  0  . lisinopril (PRINIVIL,ZESTRIL) 40 MG tablet Take 40 mg by mouth 2 (two) times daily.       . ondansetron (ZOFRAN) 4 MG tablet Take 1 tablet (4 mg total) by mouth every 6 (six) hours.  12 tablet  0  . oxyCODONE-acetaminophen (PERCOCET/ROXICET) 5-325 MG per tablet Take 1-2 tablets by mouth every 6 (six) hours as needed for pain.  15 tablet  0  . potassium chloride SA (K-DUR,KLOR-CON) 20 MEQ tablet Take 20 mEq by mouth 2 (two) times daily as needed. For shaking or dizziness or dry mouth or cramps      . warfarin (COUMADIN) 5 MG tablet Take 7.5 mg by mouth daily.         Allergies Amoxicillin; Aspirin; Penicillins; Rivaroxaban; and Xanax  Family History: History reviewed. No pertinent family history.  Social History: History   Social History  . Marital Status: Legally Separated    Spouse Name: N/A    Number of Children: N/A  . Years of Education: N/A   Occupational History  . Not on file.   Social History Main Topics  . Smoking status: Current Every Day Smoker -- 0.2 packs/day    Types: Cigarettes  . Smokeless tobacco: Not on file  . Alcohol Use: No  . Drug Use: No  . Sexually Active:    Other Topics Concern  . Not  on file   Social History Narrative  . No narrative on file    Electrocardiogram:  NSR rate 59  LVH  Done 12/21/10  Assessment and Plan

## 2012-01-28 NOTE — Assessment & Plan Note (Signed)
Well controlled.  Continue current medications and low sodium Dash type diet.    

## 2012-01-28 NOTE — Assessment & Plan Note (Signed)
Atypical Abnormal ECG with LVH from HTN  Unable to walk due to obesity and back pain  F/U two day lexiscan myovue

## 2012-01-28 NOTE — Assessment & Plan Note (Signed)
Continue coumadin F/U primary for INR

## 2012-01-28 NOTE — Assessment & Plan Note (Signed)
Counseled for less than 10 minuts  Welbutrin called in to [facility] Not currently on antidepressant just anxiolytyic

## 2012-01-28 NOTE — Patient Instructions (Signed)
Your physician recommends that you schedule a follow-up appointment in: AS NEEDED IN FUTURE IF APPT NEEDED MAY SEE  IN [facility] OFFICE Your physician has recommended you make the following change in your medication: ADD [facility]  AS DIRECETED Your physician recommends that you continue on your current medications as directed. Please refer to the Current Medication list given to you today.  Your physician has requested that you have a lexiscan myoview. For further information please visit [email or web address] Please follow instruction sheet, as given. DX CHEST PAIN

## 2012-02-04 ENCOUNTER — Encounter (HOSPITAL_COMMUNITY): Payer: Medicaid Other

## 2012-02-28 ENCOUNTER — Emergency Department (HOSPITAL_COMMUNITY)
Admission: EM | Admit: 2012-02-28 | Discharge: 2012-02-29 | Disposition: A | Discharge status: Home / Self Care | Payer: Medicaid Other | Attending: Emergency Medicine | Admitting: Emergency Medicine

## 2012-02-28 ENCOUNTER — Encounter (HOSPITAL_COMMUNITY): Payer: Self-pay | Admitting: *Deleted

## 2012-02-28 DIAGNOSIS — I10 Essential (primary) hypertension: Secondary | ICD-10-CM | POA: Insufficient documentation

## 2012-02-28 DIAGNOSIS — Z86718 Personal history of other venous thrombosis and embolism: Secondary | ICD-10-CM | POA: Insufficient documentation

## 2012-02-28 DIAGNOSIS — M79609 Pain in unspecified limb: Secondary | ICD-10-CM | POA: Insufficient documentation

## 2012-02-28 DIAGNOSIS — Z9089 Acquired absence of other organs: Secondary | ICD-10-CM | POA: Insufficient documentation

## 2012-02-28 DIAGNOSIS — G8929 Other chronic pain: Secondary | ICD-10-CM | POA: Insufficient documentation

## 2012-02-28 DIAGNOSIS — I509 Heart failure, unspecified: Secondary | ICD-10-CM | POA: Insufficient documentation

## 2012-02-28 DIAGNOSIS — R791 Abnormal coagulation profile: Secondary | ICD-10-CM | POA: Insufficient documentation

## 2012-02-28 DIAGNOSIS — Z7901 Long term (current) use of anticoagulants: Secondary | ICD-10-CM | POA: Insufficient documentation

## 2012-02-28 DIAGNOSIS — Z79899 Other long term (current) drug therapy: Secondary | ICD-10-CM | POA: Insufficient documentation

## 2012-02-28 DIAGNOSIS — Z720 Tobacco use: Secondary | ICD-10-CM

## 2012-02-28 DIAGNOSIS — M25559 Pain in unspecified hip: Secondary | ICD-10-CM | POA: Insufficient documentation

## 2012-02-28 DIAGNOSIS — E669 Obesity, unspecified: Secondary | ICD-10-CM | POA: Insufficient documentation

## 2012-02-28 DIAGNOSIS — T148XXA Other injury of unspecified body region, initial encounter: Secondary | ICD-10-CM

## 2012-02-28 LAB — COMPREHENSIVE METABOLIC PANEL
ALT: 14 U/L (ref 0–35)
Alkaline Phosphatase: 66 U/L (ref 39–117)
CO2: 24 mEq/L (ref 19–32)
Chloride: 105 mEq/L (ref 96–112)
GFR calc Af Amer: 78 mL/min — ABNORMAL LOW (ref >90)
GFR calc non Af Amer: 67 mL/min — ABNORMAL LOW (ref >90)
Glucose, Bld: 105 mg/dL — ABNORMAL HIGH (ref 70–99)
Potassium: 3.6 mEq/L (ref 3.5–5.1)
Sodium: 137 mEq/L (ref 135–145)
Total Bilirubin: 0.4 mg/dL (ref 0.3–1.2)

## 2012-02-28 LAB — CBC
HCT: 41.5 % (ref 36.0–46.0)
MCHC: 34.2 g/dL (ref 30.0–36.0)
RDW: 14.1 % (ref 11.5–15.5)

## 2012-02-28 LAB — PROTIME-INR
INR: 1.05 (ref 0.00–1.49)
Prothrombin Time: 13.6 seconds (ref 11.6–15.2)

## 2012-02-28 MED ORDER — BUPROPION HCL ER (SR) 150 MG PO TB12: 150.0000 mg | ORAL_TABLET | Freq: Two times a day (BID) | ORAL | Status: DC

## 2012-02-28 MED ORDER — CLONIDINE HCL 0.2 MG PO TABS
0.2000 mg | ORAL_TABLET | Freq: Two times a day (BID) | ORAL | Status: DC
  Administered 2012-02-28: 0.2 mg via ORAL
  Filled 2012-02-28: qty 1

## 2012-02-28 MED ORDER — WARFARIN - PHYSICIAN DOSING INPATIENT: Freq: Every day | Status: DC

## 2012-02-28 MED ORDER — CLONAZEPAM 0.5 MG PO TABS: 0.5000 mg | ORAL_TABLET | Freq: Two times a day (BID) | ORAL | Status: AC

## 2012-02-28 MED ORDER — CLONIDINE HCL 0.2 MG PO TABS: 0.2000 mg | ORAL_TABLET | Freq: Two times a day (BID) | ORAL | Status: AC

## 2012-02-28 MED ORDER — WARFARIN SODIUM 7.5 MG PO TABS
7.5000 mg | ORAL_TABLET | Freq: Once | ORAL | Status: AC
  Administered 2012-02-29: 7.5 mg via ORAL
  Filled 2012-02-28: qty 1

## 2012-02-28 MED ORDER — ENOXAPARIN SODIUM 120 MG/0.8ML ~~LOC~~ SOLN: 120.0000 mg | Freq: Every day | SUBCUTANEOUS | Status: DC

## 2012-02-28 MED ORDER — WARFARIN SODIUM 5 MG PO TABS: 7.5000 mg | ORAL_TABLET | Freq: Every day | ORAL | Status: DC

## 2012-02-28 MED ORDER — LISINOPRIL 40 MG PO TABS: 40.0000 mg | ORAL_TABLET | Freq: Two times a day (BID) | ORAL | Status: AC

## 2012-02-28 MED ORDER — HYDROCODONE-ACETAMINOPHEN 10-650 MG PO TABS: 1.0000 | ORAL_TABLET | Freq: Four times a day (QID) | ORAL | Status: DC | PRN

## 2012-02-28 MED ORDER — POTASSIUM CHLORIDE CRYS ER 20 MEQ PO TBCR: 20.0000 meq | EXTENDED_RELEASE_TABLET | Freq: Two times a day (BID) | ORAL | Status: AC | PRN

## 2012-02-28 MED ORDER — OXYCODONE-ACETAMINOPHEN 5-325 MG PO TABS: 1.0000 | ORAL_TABLET | ORAL | Status: DC | PRN

## 2012-02-28 MED ORDER — LISINOPRIL 20 MG PO TABS
40.0000 mg | ORAL_TABLET | Freq: Two times a day (BID) | ORAL | Status: DC
  Administered 2012-02-28: 40 mg via ORAL
  Filled 2012-02-28: qty 2

## 2012-02-28 MED ORDER — ENOXAPARIN SODIUM 120 MG/0.8ML ~~LOC~~ SOLN
120.0000 mg | Freq: Once | SUBCUTANEOUS | Status: AC
  Administered 2012-02-29: 120 mg via SUBCUTANEOUS
  Filled 2012-02-28: qty 0.8

## 2012-02-28 NOTE — ED Notes (Addendum)
Pt reports having a bruise, tenderness and 'lump' on her leg that started today.  Pt has hx of DVTs and worried that it may be one.  Pt noted to have a bruise and redness that is not warm to touch on her (R) leg. Pt reports not taking HTN medication or coumadin in 3 weeks due to not being able to get back to PCP for rx refill.  Denies SOB.  Pt reports vomiting x 1 several days ago.  Also reports palpitations several days ago.  Denies at present.

## 2012-02-28 NOTE — ED Provider Notes (Signed)
I evaluated the patient, reviewed the resident's note and I agree with the findings and plan.    Nelia Shi, MD 02/28/12 [phone]

## 2012-02-28 NOTE — ED Notes (Addendum)
Patient noticed a "hematoma" right upper lateral shin.  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  She also has HTN and she has been out of meds three weeks. Patient says she is worried that it is a DVT and called EMS.  Area is painful, bruised however she can walk on it.  Report received from [facility].

## 2012-02-28 NOTE — ED Provider Notes (Signed)
History     CSN: [id number]  Arrival date & time 02/28/12  2111   First MD Initiated Contact with Patient 02/28/12 2116      Chief Complaint  Patient presents with  . DVT    (Consider location/radiation/quality/duration/timing/severity/associated sxs/prior treatment) HPI CC: Leg pain  Pt is 51 yo obese Vaughn who presents w/ acute onset R leg pain and raised bump on R lateral surface. Pt has been sleeping on flimsy air mattress for weeks at relatives house, but denies any trauma to leg. No alleviating or aggrevating factors. Pt is worried that it is another DVT as pt has had several of these in the past. Last DVT approximately 2 years ago. Pt has been without all of her medications for the past 3 weeks due to her housing situation and not being able to make it to her doctor as she forgot his name and number. 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 Denies any SOB, CP, Syncope, dizziness, LE swelling, recent travel or long car rides.    Past Medical History  Diagnosis Date  . Hypertension   . CHF (congestive heart failure)   . Bipolar 1 disorder   . Depression   . Bronchiolitis   . Sleep apnea   . PE (pulmonary embolism)     Past Surgical History  Procedure Date  . Cesarean section   . Cholecystectomy   . Carotid stent     History reviewed. No pertinent family history.  History  Substance Use Topics  . Smoking status: Current Every Day Smoker -- 0.2 packs/day    Types: Cigarettes  . Smokeless tobacco: Not on file  . Alcohol Use: No    OB History    Grav Para Term Preterm Abortions TAB SAB Ect Mult Living   4 3 3  1  1   3       Review of Systems  Constitutional: Negative for fever, chills, activity change and appetite change.  HENT: Negative for neck pain.   Respiratory: Negative for chest tightness and shortness of breath.   Cardiovascular: Negative for chest pain, palpitations and leg swelling.    Gastrointestinal: Negative for nausea, vomiting, abdominal pain, diarrhea and constipation.  Genitourinary: Negative for dysuria, frequency and flank pain.  Musculoskeletal: Positive for arthralgias.  Skin: Negative for pallor, rash and wound.  Neurological: Negative for dizziness, syncope, light-headedness, numbness and headaches.  Hematological: Bruises/bleeds easily.    Allergies  Amoxicillin; Aspirin; Penicillins; Rivaroxaban; and Xanax  Home Medications   Current Outpatient Rx  Name Route Sig Dispense Refill  . CIPROFLOXACIN HCL 250 MG PO TABS Oral Take 250 mg by mouth 2 (two) times daily. Started 9/12    . BUPROPION HCL ER (SR) 150 MG PO TB12 Oral Take 1 tablet (150 mg total) by mouth 2 (two) times daily. 60 tablet 0  . CLONAZEPAM 0.5 MG PO TABS Oral Take 1 tablet (0.5 mg total) by mouth 2 (two) times daily. 30 tablet 0  . CLONIDINE HCL 0.2 MG PO TABS Oral Take 1 tablet (0.2 mg total) by mouth 2 (two) times daily. 60 tablet 0  . ENOXAPARIN SODIUM 120 MG/0.8ML  SOLN Subcutaneous Inject 0.8 mLs (120 mg total) into the skin daily. 4 mL 3  . HYDROCODONE-ACETAMINOPHEN 10-650 MG PO TABS Oral Take 1 tablet by mouth every 6 (six) hours as needed. For pain 30 tablet 0  . LISINOPRIL 40 MG PO TABS Oral Take 1  tablet (40 mg total) by mouth 2 (two) times daily. 60 tablet 0  . POTASSIUM CHLORIDE CRYS ER 20 MEQ PO TBCR Oral Take 1 tablet (20 mEq total) by mouth 2 (two) times daily as needed. For shaking or dizziness or dry mouth or cramps 30 tablet 0  . WARFARIN SODIUM 5 MG PO TABS Oral Take 1.5 tablets (7.5 mg total) by mouth daily. 45 tablet 0    BP 168/107  Pulse 75  Temp 98.5 F (36.9 C) (Oral)  Resp 18  SpO2 100%  Physical Exam  Nursing note and vitals reviewed. Constitutional: She is oriented to person, place, and time. She appears well-developed and well-nourished. No distress.  HENT:  Head: Normocephalic and atraumatic.  Eyes: Pupils are equal, round, and reactive to light.   Neck: Normal range of motion.  Cardiovascular: Normal rate and intact distal pulses.   Murmur (II/VI systolic murmur) heard. Pulmonary/Chest: Effort normal and breath sounds normal. No respiratory distress.  Abdominal: Soft. Normal appearance and bowel sounds are normal. She exhibits no distension.  Musculoskeletal:       R hip pain w/ movement and palpation ~3x3cm contusion of R lateral leg w/ slight discoloration of skin. Painful on palpation  Neurological: She is alert and oriented to person, place, and time. No cranial nerve deficit.  Skin: Skin is warm and dry. No rash noted.  Psychiatric: She has a normal mood and affect. Her behavior is normal.    ED Course  Procedures (including critical care time)  Labs Reviewed  COMPREHENSIVE METABOLIC PANEL - Abnormal; Notable for the following:    Glucose, Bld 105 (*)     Calcium 11.1 (*)     GFR calc non Af Amer 67 (*)     GFR calc Af Amer 78 (*)     All other components within normal limits  CBC - Abnormal; Notable for the following:    WBC 10.6 (*)     All other components within normal limits  PROTIME-INR   No results found.   1. Chronic hip pain   2. Contusion   3. Subtherapeutic international normalized ratio (INR)   4. History of DVT (deep vein thrombosis)   5. Smoking trying to quit       MDM  51yo Vaughn w/ PMHx of HTN, CHF, Bipolar, Bronchitis, OSE, and multiple past DVT/PE s/p carotid stent plcmt, presenting w/ RLE contusion and hip pain and asymptomatic hypertension. Low concern for DVT at this time As Wells' Criteria score of 1.5.  - CBC, CMET, PT/INR, Percocet, EKG - Give home dose of lisinopril and cloniding for bp - Will likely start lovenox and coumadin or xarelto pending f/u options   Update: INR is subtherapeutic. Pt feels considerably better after Percocet and improved BP control. Pt administered Lovenox and coumadin prior to DC.  Pt aware of importance of follow up for INR check. Pt states that she has  f/u within 5 days for her INR check. Will provide Pt w/ 6 days worth of Lovenox and will refill all of pt home meds Pt to use tylenol or ibuprofen for contusion.  - handout given  Shelly Flatten, MD Family Medicine PGY-2 02/28/2012, 11:51 PM             Ozella Rocks, MD 02/28/12 2351

## 2012-02-28 NOTE — ED Notes (Signed)
Pt given coke and saltine crackers 

## 2012-09-26 ENCOUNTER — Encounter: Payer: Self-pay | Admitting: Internal Medicine

## 2013-03-31 IMAGING — CR DG HIP COMPLETE 2+V*R*
3 series · 3 of 3 positions shown · non-contrast
Comparison: 06/03/2011

CLINICAL DATA: Chronic right hip pain for 4 months with occasional
numbness and tingling.

RIGHT HIP - COMPLETE 2+ VIEW

[t pelvis a.p.]
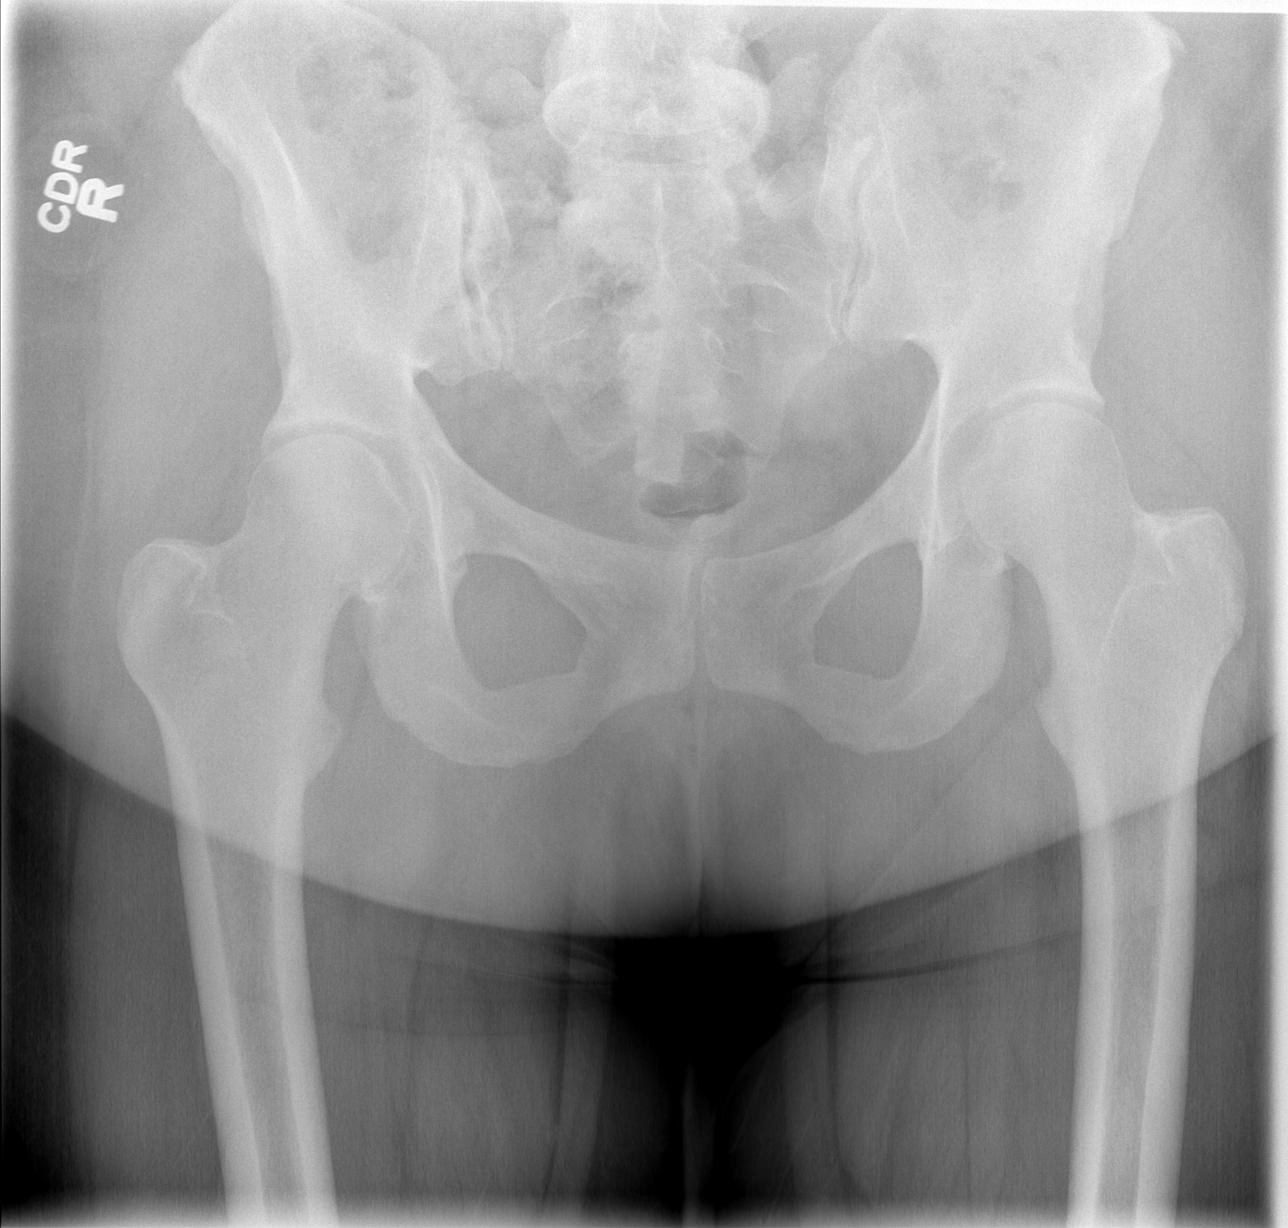

[t hip ap right]
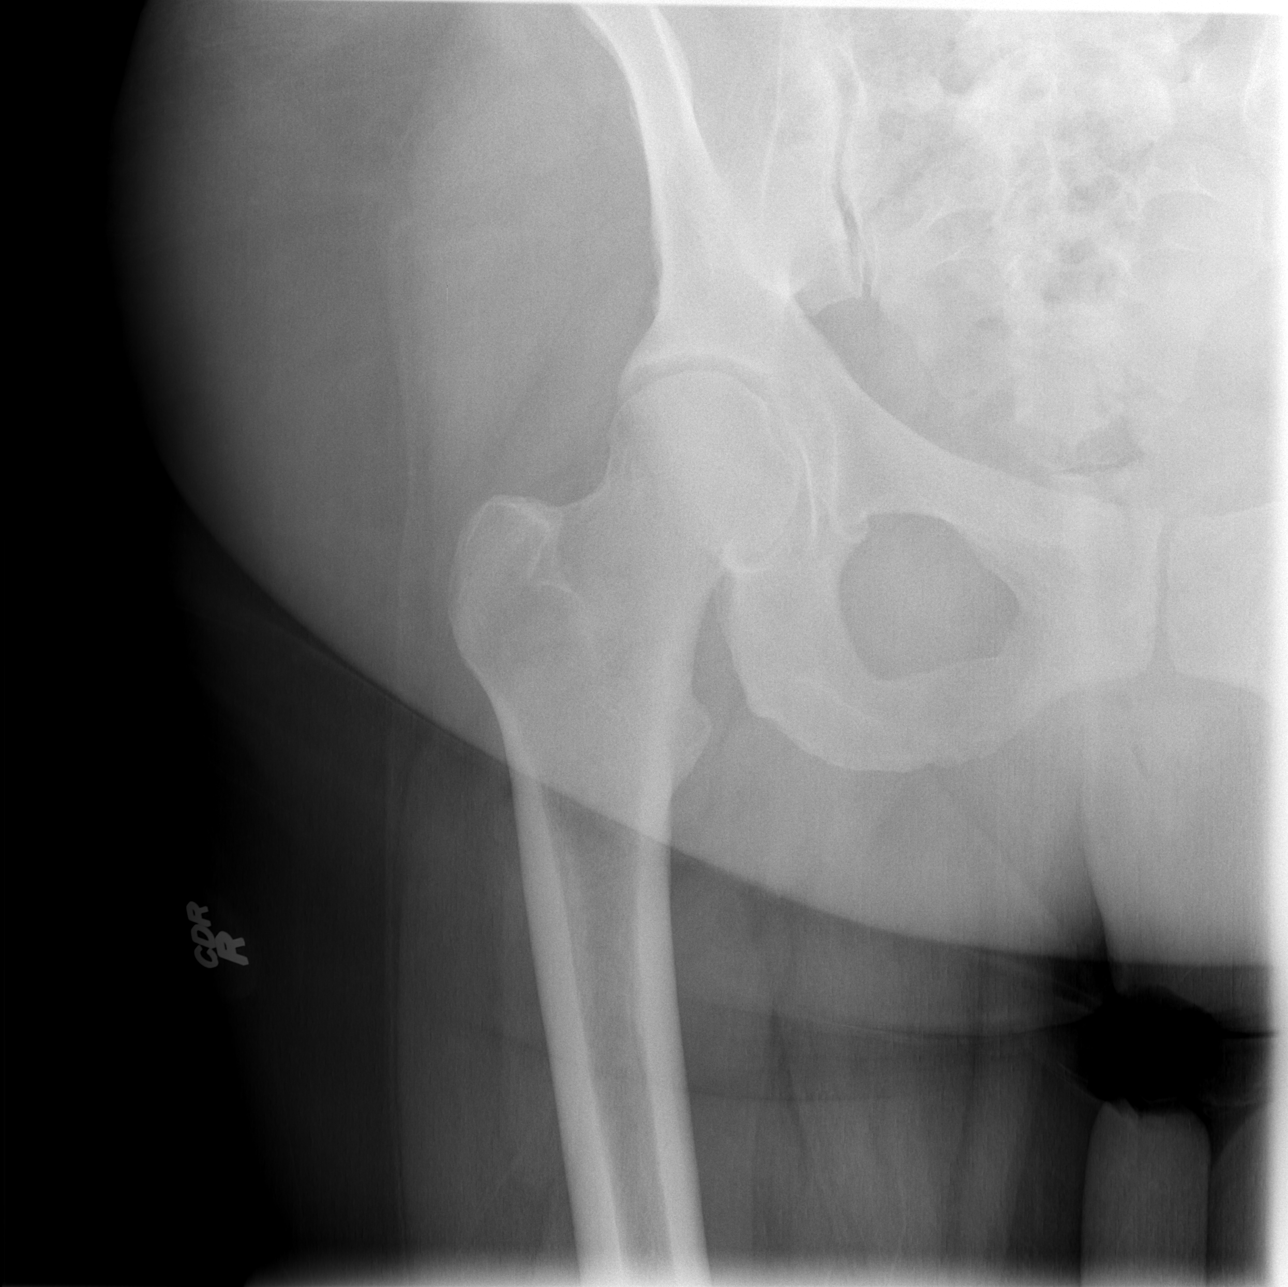

[t hip frog leg right]
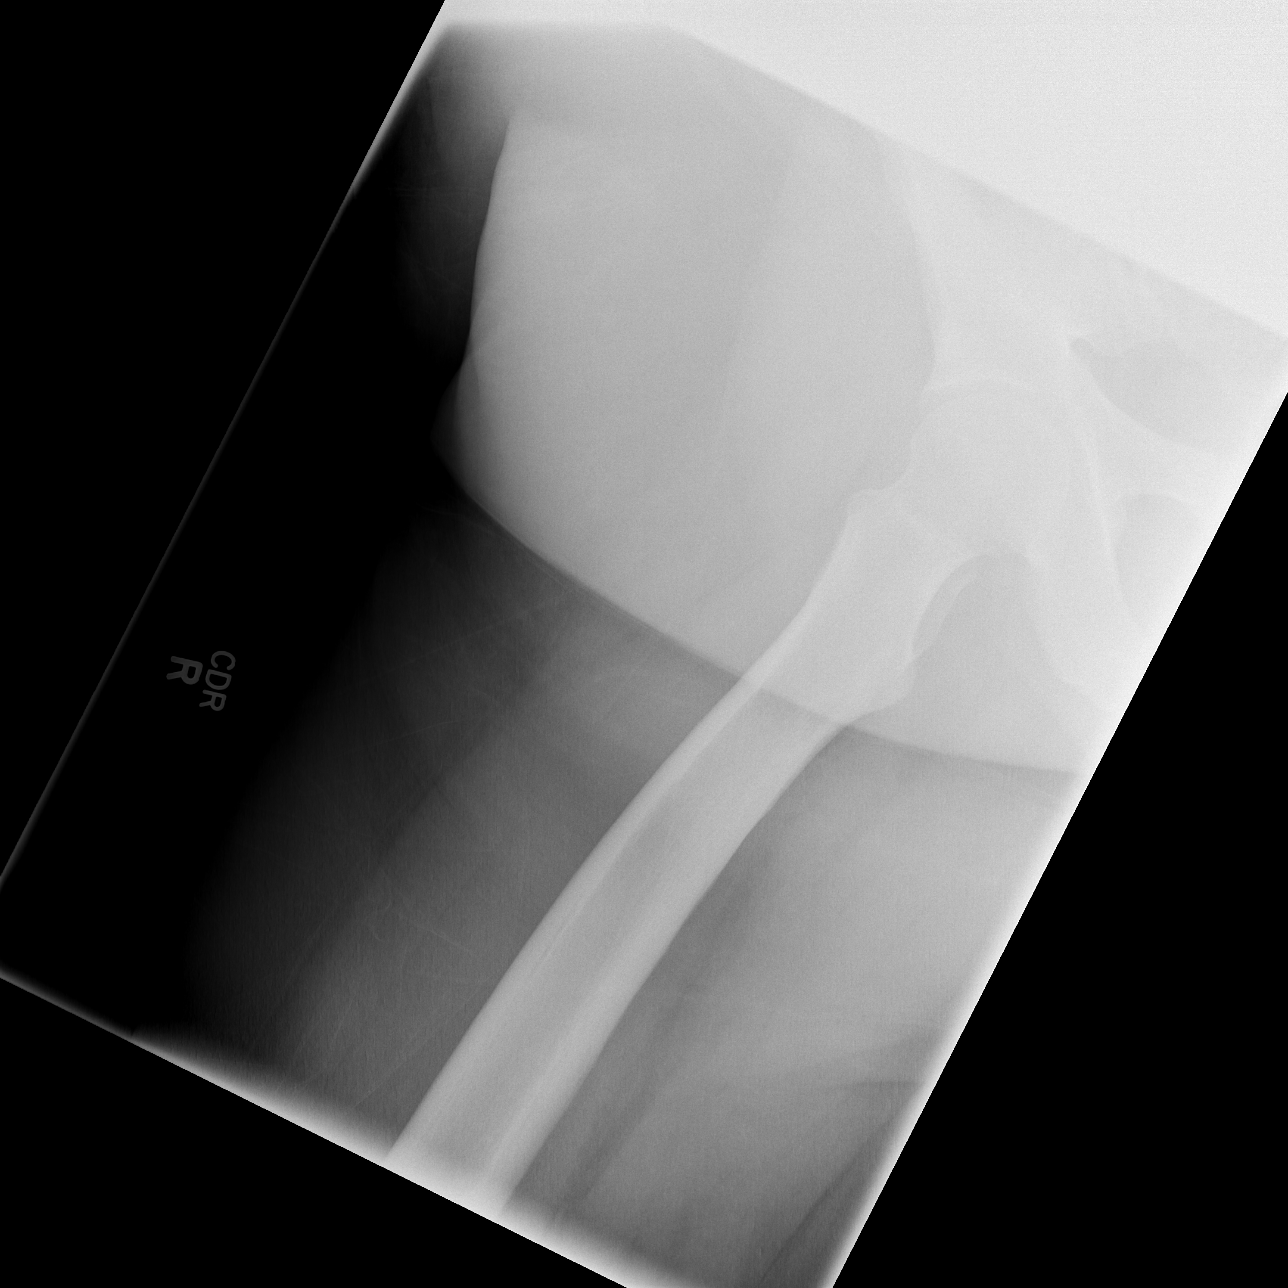

[3 of 3 positions shown; findings below may reference images not displayed]

FINDINGS: Articular space and femoral head morphology appear normal
and bilaterally symmetric.  No fracture, dislocation, or acute bony
finding observed.  Symmetric teardrop distance noted.
IMPRESSION: 1.  No specific radiographic abnormality the right hip is observed.

## 2014-03-12 ENCOUNTER — Encounter (HOSPITAL_COMMUNITY): Payer: Self-pay | Admitting: *Deleted

## 2015-04-16 ENCOUNTER — Emergency Department (HOSPITAL_COMMUNITY): Payer: Medicaid - Out of State

## 2015-04-16 ENCOUNTER — Encounter (HOSPITAL_COMMUNITY): Payer: Self-pay | Admitting: *Deleted

## 2015-04-16 ENCOUNTER — Emergency Department (HOSPITAL_COMMUNITY)
Admission: EM | Admit: 2015-04-16 | Discharge: 2015-04-16 | Disposition: A | Discharge status: Home / Self Care | Payer: Medicaid - Out of State | Attending: Emergency Medicine | Admitting: Emergency Medicine

## 2015-04-16 DIAGNOSIS — R0602 Shortness of breath: Secondary | ICD-10-CM | POA: Diagnosis not present

## 2015-04-16 DIAGNOSIS — Z880 Allergy status to penicillin: Secondary | ICD-10-CM | POA: Diagnosis not present

## 2015-04-16 DIAGNOSIS — R112 Nausea with vomiting, unspecified: Secondary | ICD-10-CM | POA: Diagnosis not present

## 2015-04-16 DIAGNOSIS — R079 Chest pain, unspecified: Secondary | ICD-10-CM | POA: Diagnosis present

## 2015-04-16 DIAGNOSIS — Z86711 Personal history of pulmonary embolism: Secondary | ICD-10-CM | POA: Insufficient documentation

## 2015-04-16 DIAGNOSIS — I10 Essential (primary) hypertension: Secondary | ICD-10-CM | POA: Diagnosis not present

## 2015-04-16 DIAGNOSIS — I509 Heart failure, unspecified: Secondary | ICD-10-CM | POA: Diagnosis not present

## 2015-04-16 DIAGNOSIS — F17210 Nicotine dependence, cigarettes, uncomplicated: Secondary | ICD-10-CM | POA: Insufficient documentation

## 2015-04-16 DIAGNOSIS — R51 Headache: Secondary | ICD-10-CM | POA: Insufficient documentation

## 2015-04-16 DIAGNOSIS — F319 Bipolar disorder, unspecified: Secondary | ICD-10-CM | POA: Diagnosis not present

## 2015-04-16 DIAGNOSIS — R109 Unspecified abdominal pain: Secondary | ICD-10-CM | POA: Diagnosis not present

## 2015-04-16 LAB — CBC WITH DIFFERENTIAL/PLATELET
Basophils Absolute: 0 10*3/uL (ref 0.0–0.1)
Basophils Relative: 0 %
EOS ABS: 0.2 10*3/uL (ref 0.0–0.7)
Eosinophils Relative: 2 %
HEMATOCRIT: 41.3 % (ref 36.0–46.0)
HEMOGLOBIN: 13.6 g/dL (ref 12.0–15.0)
LYMPHS ABS: 2.8 10*3/uL (ref 0.7–4.0)
Lymphocytes Relative: 32 %
MCH: 27.7 pg (ref 26.0–34.0)
MCHC: 32.9 g/dL (ref 30.0–36.0)
MCV: 84.1 fL (ref 78.0–100.0)
Monocytes Absolute: 0.5 10*3/uL (ref 0.1–1.0)
Monocytes Relative: 6 %
NEUTROS ABS: 5.2 10*3/uL (ref 1.7–7.7)
NEUTROS PCT: 60 %
Platelets: 197 10*3/uL (ref 150–400)
RBC: 4.91 MIL/uL (ref 3.87–5.11)
RDW: 14.5 % (ref 11.5–15.5)
WBC: 8.7 10*3/uL (ref 4.0–10.5)

## 2015-04-16 LAB — BASIC METABOLIC PANEL
Anion gap: 7 (ref 5–15)
BUN: 11 mg/dL (ref 6–20)
CHLORIDE: 107 mmol/L (ref 101–111)
CO2: 23 mmol/L (ref 22–32)
Calcium: 10.6 mg/dL — ABNORMAL HIGH (ref 8.9–10.3)
Creatinine, Ser: 1.02 mg/dL — ABNORMAL HIGH (ref 0.44–1.00)
GFR calc non Af Amer: >60 mL/min (ref >60)
Glucose, Bld: 101 mg/dL — ABNORMAL HIGH (ref 65–99)
POTASSIUM: 3.5 mmol/L (ref 3.5–5.1)
SODIUM: 137 mmol/L (ref 135–145)

## 2015-04-16 LAB — I-STAT TROPONIN, ED: TROPONIN I, POC: 0.04 ng/mL (ref 0.00–0.08)

## 2015-04-16 NOTE — ED Notes (Signed)
Pt in from home, states, "I had a green stent placed six years ago & I don't feel right. It has been off & on but worse over the last 3 years. My lawyer told me to have it removed." pt c/o SOB & reports being evaluated at PCP for the SOB, pt c/o intermittent n/v, pt speaks in full sentences, A&o X4

## 2015-04-16 NOTE — ED Notes (Signed)
Pt refused to keep BP and Pulse on

## 2015-04-16 NOTE — Discharge Instructions (Signed)
Call your primary care provider in [place] tomorrow to schedule an appointment to be seen in the next 3-4 days. Speak with your primary care provider regarding a referral for cardiology. Return to the emergency department if symptoms worsen or new onset of fever, difficulty breathing, chest pain, coughing up blood, vomiting blood, syncope, seizure.

## 2015-04-16 NOTE — ED Provider Notes (Signed)
CSN: [id number]     Arrival date & time 04/16/15  1206 History   First MD Initiated Contact with Patient 04/16/15 1509     No chief complaint on file.    (Consider location/radiation/quality/duration/timing/severity/associated sxs/prior Treatment) HPI   Patient is a 54 year old female with past medical history of hypertension, CHF, PE who presents to the ED with complaint of chest pain. Patient reports she has had intermittent sharp midsternal chest pain for the past year. She states the pain is worse with deep breathing. Patient endorses associated shortness of breath, productive cough (green sputum), nausea, NBNB vomitingx2 (yesterday), diffuse abdominal pain, headache. Patient states she had an IVC stent placed in 2013 and never followed up with her physician. She notes she recently saw a lawyer who advised for her to have her IVC filter removed.  Past Medical History  Diagnosis Date  . Hypertension   . CHF (congestive heart failure) (HCC)   . Bipolar 1 disorder (HCC)   . Depression   . Bronchiolitis   . Sleep apnea   . PE (pulmonary embolism)    Past Surgical History  Procedure Laterality Date  . Cesarean section    . Cholecystectomy    . Carotid stent     No family history on file. Social History  Substance Use Topics  . Smoking status: Current Every Day Smoker -- 0.25 packs/day    Types: Cigarettes  . Smokeless tobacco: None  . Alcohol Use: No   OB History    Gravida Para Term Preterm AB TAB SAB Ectopic Multiple Living   Review of Systems  Respiratory: Positive for cough and shortness of breath.   Cardiovascular: Positive for chest pain.  Gastrointestinal: Positive for nausea, vomiting and abdominal pain.  Neurological: Positive for headaches.  All other systems reviewed and are negative.     Allergies  Amoxicillin; Aspirin; Penicillins; Rivaroxaban; and Xanax  Home Medications   Prior to Admission medications   Medication Sig Start  Date End Date Taking? Authorizing Provider  clonazePAM (KLONOPIN) 0.5 MG tablet Take 1 tablet (0.5 mg total) by mouth 2 (two) times daily. 02/28/12  Yes Ozella Rocks, MD  cloNIDine (CATAPRES) 0.2 MG tablet Take 1 tablet (0.2 mg total) by mouth 2 (two) times daily. 02/28/12  Yes Ozella Rocks, MD  estrogens, conjugated, (PREMARIN) 0.3 MG tablet Take 0.3 mg by mouth daily. Take daily for 21 days then do not take for 7 days.   Yes Historical Provider, MD  HYDROcodone-acetaminophen (NORCO) 10-325 MG tablet Take 1 tablet by mouth every 6 (six) hours as needed for moderate pain.  03/29/15  Yes Historical Provider, MD  lisinopril (PRINIVIL,ZESTRIL) 40 MG tablet Take 1 tablet (40 mg total) by mouth 2 (two) times daily. 02/28/12  Yes Ozella Rocks, MD  potassium chloride SA (K-DUR,KLOR-CON) 20 MEQ tablet Take 1 tablet (20 mEq total) by mouth 2 (two) times daily as needed. For shaking or dizziness or dry mouth or cramps 02/28/12  Yes Ozella Rocks, MD   BP 180/109 mmHg  Pulse 79  Temp(Src) 98.1 F (36.7 C) (Oral)  Resp 18  SpO2 100%  LMP 03/19/2015 Physical Exam  Constitutional: She is oriented to person, place, and time. She appears well-developed and well-nourished. No distress.  HENT:  Head: Normocephalic and atraumatic.  Mouth/Throat: Oropharynx is clear and moist. No oropharyngeal exudate.  Eyes: Conjunctivae and EOM are normal. Pupils are equal, round,  and reactive to light. Right eye exhibits no discharge. Left eye exhibits no discharge. No scleral icterus.  Neck: Normal range of motion. Neck supple.  Cardiovascular: Normal rate, regular rhythm, normal heart sounds and intact distal pulses.   Pulmonary/Chest: Effort normal and breath sounds normal. She exhibits tenderness (midsternal chest wall TTP).  Abdominal: Soft. Bowel sounds are normal. She exhibits no distension and no mass. There is no tenderness. There is no rebound and no guarding.  Musculoskeletal: Normal range of motion.  She exhibits no edema.  Lymphadenopathy:    She has no cervical adenopathy.  Neurological: She is alert and oriented to person, place, and time.  Skin: Skin is warm and dry. She is not diaphoretic.  Nursing note and vitals reviewed.   ED Course  Procedures (including critical care time) Labs Review Labs Reviewed  BASIC METABOLIC PANEL - Abnormal; Notable for the following:    Glucose, Bld 101 (*)    Creatinine, Ser 1.02 (*)    Calcium 10.6 (*)    All other components within normal limits  CBC WITH DIFFERENTIAL/PLATELET  [facility] SensorI-STAT TROPOININ, ED    Imaging Review Dg Chest 2 View  04/16/2015  CLINICAL DATA:  Chest pain right upper chest. EXAM: CHEST  2 VIEW COMPARISON:  12/31/2011 FINDINGS: The heart size and mediastinal contours are within normal limits. Both lungs are clear. The visualized skeletal structures are unremarkable. IMPRESSION: No active cardiopulmonary disease. Electronically Signed   By: Charlett NoseKevin  Dover M.D.   On: 04/16/2015 16:28   Dg Abd 1 View  04/16/2015  CLINICAL DATA:  Acute onset of epigastric abdominal pain. Follow-up IVC filter position. Initial encounter. EXAM: ABDOMEN - 1 VIEW COMPARISON:  Lumbar spine radiograph performed 11/20/2011 FINDINGS: The IVC filter is noted in grossly unchanged position, overlying the right side of vertebral bodies L2 and L3. The visualized bowel gas pattern is unremarkable. Scattered air and stool filled loops of colon are seen; no abnormal dilatation of small bowel loops is seen to suggest small bowel obstruction. No free intra-abdominal air is identified, though evaluation for free air is limited on a single supine view. Clips are noted within the right upper quadrant, reflecting prior cholecystectomy. The visualized osseous structures are within normal limits; the sacroiliac joints are unremarkable in appearance. IMPRESSION: IVC filter noted in grossly unchanged position, overlying the right side of vertebral bodies L2 and L3. Electronically  Signed   By: Roanna RaiderJeffery  Chang M.D.   On: 04/16/2015 19:07   I have personally reviewed and evaluated these images and lab results as part of my medical decision-making.   EKG Interpretation   Date/Time:  Tuesday April 16 2015 15:36:39 EST Ventricular Rate:  75 PR Interval:  181 QRS Duration: 108 QT Interval:  389 QTC Calculation: 434 R Axis:   -22 Text Interpretation:  Sinus rhythm LVH with secondary repolarization  abnormality Baseline wander in lead(s) V1 V2 No significant change since  last tracing Confirmed by Anitra LauthPLUNKETT  MD, Alphonzo LemmingsWHITNEY ([id number]) on 04/16/2015  5:24:19 PM      MDM   Final diagnoses:  Chest pain, unspecified chest pain type    Patient presents with chest pain that she has had intermittently for the past year. She notes she had IVC filter placed in 2013, denies any follow-up after surgery and states she is concerned about her IVC filter. VSS. Exam revealed mild midsternal chest wall tender to palpation, otherwise unremarkable. EKG showed sinus rhythm, no changes from prior. Troponin negative. Labs unremarkable. Chest x-ray unremarkable. Abdominal x-ray  revealed IVC filter in grossly unchanged position. I have a low suspicion for ACS, PE, dissection, or other acute cardiac event at this time.  Case manager spoke with pt regarding PCP follow up through her medicaid. Advised pt to follow up with PCP in [place] regarding outpatient management of her BP and for referral to see a cardiologist. Discussed results and plan for d/c with pt.   Evaluation does not show pathology requring ongoing emergent intervention or admission. Pt is hemodynamically stable and mentating appropriately. Discussed findings/results and plan with patient/guardian, who agrees with plan. All questions answered. Return precautions discussed and outpatient follow up given.    Satira Sark [place], [place] 04/16/15 2010  Gwyneth Sprout, MD 04/16/15 2034

## 2015-05-19 ENCOUNTER — Emergency Department (HOSPITAL_COMMUNITY)
Admission: EM | Admit: 2015-05-19 | Discharge: 2015-05-19 | Discharge status: Against Medical Advice | Payer: Medicaid - Out of State | Attending: Emergency Medicine | Admitting: Emergency Medicine

## 2015-05-19 ENCOUNTER — Emergency Department (HOSPITAL_COMMUNITY): Payer: Medicaid - Out of State

## 2015-05-19 ENCOUNTER — Encounter (HOSPITAL_COMMUNITY): Payer: Self-pay

## 2015-05-19 DIAGNOSIS — R197 Diarrhea, unspecified: Secondary | ICD-10-CM | POA: Diagnosis not present

## 2015-05-19 DIAGNOSIS — R51 Headache: Secondary | ICD-10-CM | POA: Diagnosis not present

## 2015-05-19 DIAGNOSIS — I10 Essential (primary) hypertension: Secondary | ICD-10-CM | POA: Insufficient documentation

## 2015-05-19 DIAGNOSIS — Z79899 Other long term (current) drug therapy: Secondary | ICD-10-CM | POA: Diagnosis not present

## 2015-05-19 DIAGNOSIS — Z880 Allergy status to penicillin: Secondary | ICD-10-CM | POA: Diagnosis not present

## 2015-05-19 DIAGNOSIS — R059 Cough, unspecified: Secondary | ICD-10-CM

## 2015-05-19 DIAGNOSIS — F17210 Nicotine dependence, cigarettes, uncomplicated: Secondary | ICD-10-CM | POA: Insufficient documentation

## 2015-05-19 DIAGNOSIS — Z8669 Personal history of other diseases of the nervous system and sense organs: Secondary | ICD-10-CM | POA: Insufficient documentation

## 2015-05-19 DIAGNOSIS — Z86711 Personal history of pulmonary embolism: Secondary | ICD-10-CM | POA: Insufficient documentation

## 2015-05-19 DIAGNOSIS — R05 Cough: Secondary | ICD-10-CM | POA: Insufficient documentation

## 2015-05-19 DIAGNOSIS — I509 Heart failure, unspecified: Secondary | ICD-10-CM | POA: Insufficient documentation

## 2015-05-19 DIAGNOSIS — R071 Chest pain on breathing: Secondary | ICD-10-CM | POA: Diagnosis not present

## 2015-05-19 DIAGNOSIS — J029 Acute pharyngitis, unspecified: Secondary | ICD-10-CM | POA: Diagnosis not present

## 2015-05-19 DIAGNOSIS — F319 Bipolar disorder, unspecified: Secondary | ICD-10-CM | POA: Insufficient documentation

## 2015-05-19 LAB — COMPREHENSIVE METABOLIC PANEL
ALK PHOS: 81 U/L (ref 38–126)
ALT: 23 U/L (ref 14–54)
AST: 28 U/L (ref 15–41)
Albumin: 4 g/dL (ref 3.5–5.0)
Anion gap: 9 (ref 5–15)
BUN: 12 mg/dL (ref 6–20)
CALCIUM: 11.5 mg/dL — AB (ref 8.9–10.3)
CHLORIDE: 100 mmol/L — AB (ref 101–111)
CO2: 31 mmol/L (ref 22–32)
CREATININE: 0.93 mg/dL (ref 0.44–1.00)
Glucose, Bld: 97 mg/dL (ref 65–99)
Potassium: 3.9 mmol/L (ref 3.5–5.1)
Sodium: 140 mmol/L (ref 135–145)
Total Bilirubin: 0.8 mg/dL (ref 0.3–1.2)
Total Protein: 7.5 g/dL (ref 6.5–8.1)

## 2015-05-19 LAB — CBC WITH DIFFERENTIAL/PLATELET
BASOS PCT: 1 %
Basophils Absolute: 0 10*3/uL (ref 0.0–0.1)
EOS ABS: 0.1 10*3/uL (ref 0.0–0.7)
EOS PCT: 2 %
HCT: 43.1 % (ref 36.0–46.0)
Hemoglobin: 14 g/dL (ref 12.0–15.0)
LYMPHS ABS: 2.3 10*3/uL (ref 0.7–4.0)
Lymphocytes Relative: 30 %
MCH: 27 pg (ref 26.0–34.0)
MCHC: 32.5 g/dL (ref 30.0–36.0)
MCV: 83.2 fL (ref 78.0–100.0)
MONOS PCT: 7 %
Monocytes Absolute: 0.5 10*3/uL (ref 0.1–1.0)
NEUTROS PCT: 60 %
Neutro Abs: 4.7 10*3/uL (ref 1.7–7.7)
PLATELETS: 224 10*3/uL (ref 150–400)
RBC: 5.18 MIL/uL — ABNORMAL HIGH (ref 3.87–5.11)
RDW: 14.3 % (ref 11.5–15.5)
WBC: 7.7 10*3/uL (ref 4.0–10.5)

## 2015-05-19 LAB — I-STAT TROPONIN, ED: TROPONIN I, POC: 0.02 ng/mL (ref 0.00–0.08)

## 2015-05-19 LAB — D-DIMER, QUANTITATIVE (NOT AT ARMC): D DIMER QUANT: 0.54 ug{FEU}/mL — AB (ref 0.00–0.50)

## 2015-05-19 NOTE — ED Provider Notes (Signed)
CSN: [id number]     Arrival date & time 05/19/15  1511 History   First MD Initiated Contact with Patient 05/19/15 1941     Chief Complaint  Patient presents with  . Cough  . Diarrhea     Patient is a 55 y.o. female presenting with cough and diarrhea. The history is provided by the patient. No language interpreter was used.  Cough Diarrhea  Amber Vaughn is a 55 y.o. female who presents to the Emergency Department complaining of multiple complaints.  She reports 8 days of cough, headache, diarrhea.  The cough is productive of yellow and blood striped.  She has chest pain with breathing.  She reports temperature of 100 at home.  She has sore throat.  No vomiting.  She has diarrhea, four-five episodes daily.  She has multiple sick contacts with similar sxs.  Sxs are moderate, constant, worsening.  She has a greenfield filter for hx/o PE.    Past Medical History  Diagnosis Date  . Hypertension   . CHF (congestive heart failure) (HCC)   . Bipolar 1 disorder (HCC)   . Depression   . Bronchiolitis   . Sleep apnea   . PE (pulmonary embolism)    Past Surgical History  Procedure Laterality Date  . Cesarean section    . Cholecystectomy    . Carotid stent     History reviewed. No pertinent family history. Social History  Substance Use Topics  . Smoking status: Current Every Day Smoker -- 0.25 packs/day    Types: Cigarettes  . Smokeless tobacco: None  . Alcohol Use: No   OB History    Gravida Para Term Preterm AB TAB SAB Ectopic Multiple Living   4 3 3  1  1   3      Review of Systems  Respiratory: Positive for cough.   Gastrointestinal: Positive for diarrhea.  All other systems reviewed and are negative.     Allergies  Rivaroxaban; Amoxicillin; Aspirin; Penicillins; and Xanax  Home Medications   Prior to Admission medications   Medication Sig Start Date End Date Taking? Authorizing Provider  clonazePAM (KLONOPIN) 0.5 MG tablet Take 1 tablet (0.5 mg total) by mouth 2  (two) times daily. 02/28/12  Yes Ozella Rocksavid J Merrell, MD  cloNIDine (CATAPRES) 0.2 MG tablet Take 1 tablet (0.2 mg total) by mouth 2 (two) times daily. 02/28/12  Yes Ozella Rocksavid J Merrell, MD  estrogens, conjugated, (PREMARIN) 0.3 MG tablet Take 0.3 mg by mouth daily. Take daily for 21 days then do not take for 7 days.   Yes Historical Provider, MD  lisinopril (PRINIVIL,ZESTRIL) 40 MG tablet Take 1 tablet (40 mg total) by mouth 2 (two) times daily. 02/28/12  Yes Ozella Rocksavid J Merrell, MD  potassium chloride SA (K-DUR,KLOR-CON) 20 MEQ tablet Take 1 tablet (20 mEq total) by mouth 2 (two) times daily as needed. For shaking or dizziness or dry mouth or cramps Patient taking differently: Take 20 mEq by mouth 2 (two) times daily. For shaking or dizziness or dry mouth or cramps 02/28/12  Yes Ozella Rocksavid J Merrell, MD  Pseudoeph-Doxylamine-DM-APAP (DAYQUIL/NYQUIL COLD/FLU RELIEF PO) Take 2 capsules by mouth 2 (two) times daily as needed (FOR COLD).   Yes Historical Provider, MD   BP 187/87 mmHg  Pulse 65  Temp(Src) 98.7 F (37.1 C) (Oral)  Resp 20  Ht 5\' 6"  (1.676 m)  Wt 279 lb 4.8 oz (126.69 kg)  BMI 45.10 kg/m2  SpO2 100%  LMP 04/25/2015 Physical Exam  Constitutional: She  is oriented to person, place, and time. She appears well-developed and well-nourished.  HENT:  Head: Normocephalic and atraumatic.  Cardiovascular: Normal rate and regular rhythm.   No murmur heard. Pulmonary/Chest: Effort normal and breath sounds normal. No respiratory distress.  Abdominal: Soft. There is no tenderness. There is no rebound and no guarding.  Musculoskeletal: She exhibits no edema or tenderness.  Neurological: She is alert and oriented to person, place, and time.  Skin: Skin is warm and dry.  Psychiatric: She has a normal mood and affect. Her behavior is normal.  Nursing note and vitals reviewed.   ED Course  Procedures (including critical care time) Labs Review Labs Reviewed  COMPREHENSIVE METABOLIC PANEL - Abnormal;  Notable for the following:    Chloride 100 (*)    Calcium 11.5 (*)    All other components within normal limits  CBC WITH DIFFERENTIAL/PLATELET - Abnormal; Notable for the following:    RBC 5.18 (*)    All other components within normal limits  D-DIMER, QUANTITATIVE (NOT AT [facility]) - Abnormal; Notable for the following:    D-Dimer, Quant 0.54 (*)    All other components within normal limits  I-STAT TROPOININ, ED    Imaging Review Dg Chest 2 View  05/19/2015  CLINICAL DATA:  Productive cough EXAM: CHEST - 2 VIEW COMPARISON:  04/16/2015 FINDINGS: The heart size and mediastinal contours are within normal limits. Both lungs are clear. The visualized skeletal structures are unremarkable. IMPRESSION: No active disease. Electronically Signed   By: Alcide Clever M.D.   On: 05/19/2015 16:53   I have personally reviewed and evaluated these images and lab results as part of my medical decision-making.   EKG Interpretation   Date/Time:  Sunday May 19 2015 21:00:36 EST Ventricular Rate:  66 PR Interval:  184 QRS Duration: 107 QT Interval:  424 QTC Calculation: 444 R Axis:   -44 Text Interpretation:  Sinus rhythm Probable left atrial enlargement Left  anterior fascicular block Abnormal R-wave progression, late transition LVH  with secondary repolarization abnormality Anterior ST elevation, probably  due to LVH Confirmed by Lincoln Brigham [phone]) on 05/19/2015 10:17:38 PM      MDM   Final diagnoses:  None    Patient here for evaluation of multiple complaints but primarily cough and diarrhea. She has multiple medical problems including history of hypertension, pulmonary embolism. She is nontoxic appearing on examination with no respiratory distress. Patient left prior to full emergency department evaluation or repeat evaluation.    Tilden Fossa, MD 05/19/15 2326

## 2015-05-19 NOTE — ED Notes (Signed)
edp notified of pts  Reasolning.Marland Kitchen. She will see the pt when she can

## 2015-05-19 NOTE — ED Notes (Signed)
The pt is upset that she is still here wants to talk to the doctor again  otheriwise she has to leave  She cannot wait any longer she is going back in for 5 minutes  Then she is leaving

## 2015-05-19 NOTE — ED Notes (Signed)
Pt. Not in room

## 2015-05-19 NOTE — ED Notes (Signed)
Ill for one week sometimes a temp cough productive.   Chest soreness

## 2015-05-19 NOTE — ED Notes (Addendum)
Onset 1 week productive cough, yellow phlegm and sometimes blood streaked and diarrhea, has had 4 today, green and brown in color.  Talking in complete sentences.  Pt has tried home remedies and cough med with no relief.  Several family members in household has cough symptoms, no one has vomiting/diarrhea symptoms.  Pt also reporting yellow thick vaginal discharge x 2 weeks, not sure if there is an odor.

## 2015-05-19 NOTE — ED Notes (Signed)
Called for triage no answer x 1 .

## 2016-06-22 IMAGING — CR DG ABDOMEN 1V
1 series · 1 of 1 positions shown · non-contrast
Comparison: Lumbar spine radiograph performed 11/20/2011

CLINICAL DATA: Acute onset of epigastric abdominal pain. Follow-up
IVC filter position. Initial encounter.

EXAM:
ABDOMEN - 1 VIEW

[abdomen kub]
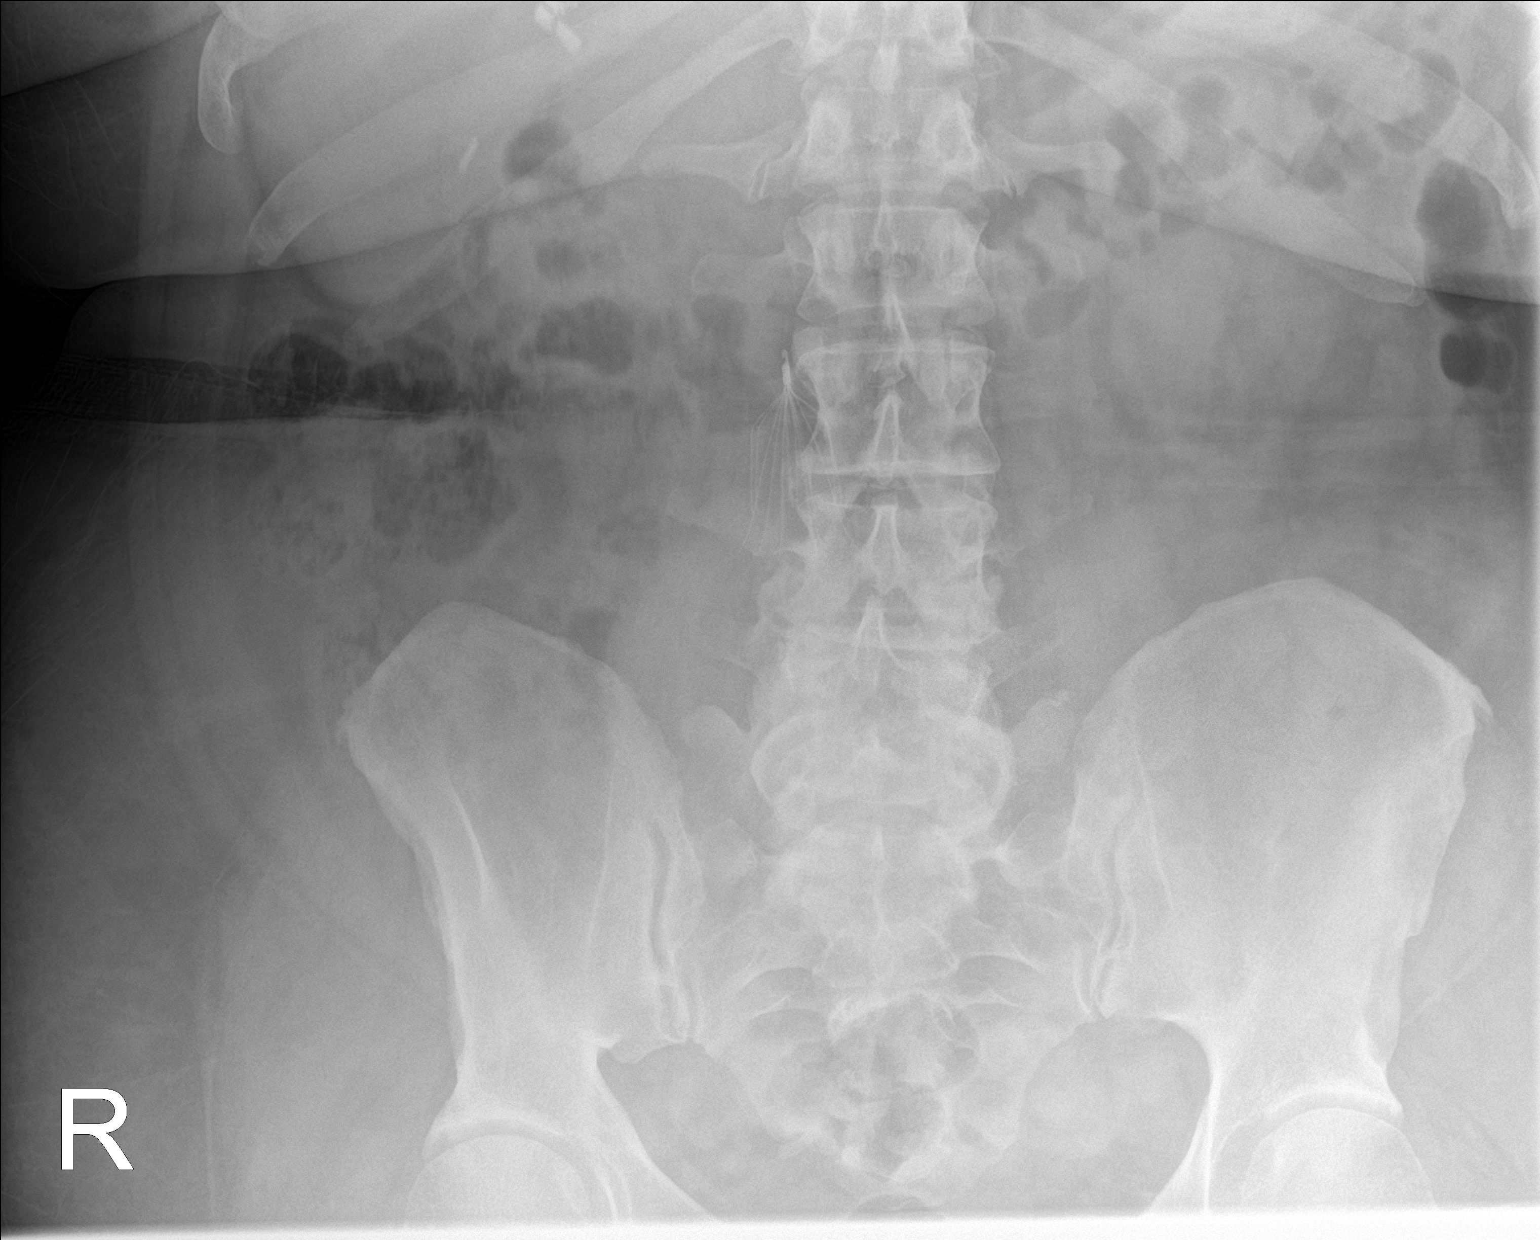

[1 of 1 positions shown; findings below may reference images not displayed]

FINDINGS: The IVC filter is noted in grossly unchanged position, overlying the
right side of vertebral bodies L2 and L3.

The visualized bowel gas pattern is unremarkable. Scattered air and
stool filled loops of colon are seen; no abnormal dilatation of
small bowel loops is seen to suggest small bowel obstruction. No
free intra-abdominal air is identified, though evaluation for free
air is limited on a single supine view. Clips are noted within the
right upper quadrant, reflecting prior cholecystectomy.

The visualized osseous structures are within normal limits; the
sacroiliac joints are unremarkable in appearance.
IMPRESSION: IVC filter noted in grossly unchanged position, overlying the right
side of vertebral bodies L2 and L3.
